# Patient Record
Sex: Female | Born: 1950 | Race: Black or African American | Hispanic: No | Marital: Married | Smoking: Never smoker
Health system: Southern US, Community
[De-identification: ages and names within clinical notes are randomized; demographics above are authoritative.]

## PROBLEM LIST (undated history)

## (undated) DIAGNOSIS — M5020 Other cervical disc displacement, unspecified cervical region: Secondary | ICD-10-CM

## (undated) DIAGNOSIS — M5412 Radiculopathy, cervical region: Secondary | ICD-10-CM

## (undated) DIAGNOSIS — F329 Major depressive disorder, single episode, unspecified: Secondary | ICD-10-CM

## (undated) DIAGNOSIS — R202 Paresthesia of skin: Secondary | ICD-10-CM

## (undated) DIAGNOSIS — I82409 Acute embolism and thrombosis of unspecified deep veins of unspecified lower extremity: Secondary | ICD-10-CM

## (undated) DIAGNOSIS — F419 Anxiety disorder, unspecified: Secondary | ICD-10-CM

## (undated) DIAGNOSIS — Z8489 Family history of other specified conditions: Secondary | ICD-10-CM

## (undated) DIAGNOSIS — Z9889 Other specified postprocedural states: Secondary | ICD-10-CM

## (undated) DIAGNOSIS — R112 Nausea with vomiting, unspecified: Secondary | ICD-10-CM

## (undated) DIAGNOSIS — M542 Cervicalgia: Secondary | ICD-10-CM

## (undated) DIAGNOSIS — I10 Essential (primary) hypertension: Secondary | ICD-10-CM

## (undated) DIAGNOSIS — M1990 Unspecified osteoarthritis, unspecified site: Secondary | ICD-10-CM

## (undated) DIAGNOSIS — M47812 Spondylosis without myelopathy or radiculopathy, cervical region: Secondary | ICD-10-CM

## (undated) DIAGNOSIS — R200 Anesthesia of skin: Secondary | ICD-10-CM

## (undated) DIAGNOSIS — F32A Depression, unspecified: Secondary | ICD-10-CM

## (undated) HISTORY — PX: STYLOID PROCESS EXCISION: SHX5198

## (undated) HISTORY — PX: LUMBAR LAMINECTOMY: SHX95

## (undated) HISTORY — PX: CERVICAL LAMINECTOMY: SHX94

## (undated) HISTORY — PX: CERVICAL DISCECTOMY: SHX98

---

## 2010-03-20 ENCOUNTER — Ambulatory Visit (HOSPITAL_COMMUNITY): Admission: RE | Admit: 2010-03-20 | Payer: Self-pay | Source: Home / Self Care | Admitting: Neurosurgery

## 2010-04-19 ENCOUNTER — Other Ambulatory Visit (HOSPITAL_COMMUNITY): Payer: Self-pay

## 2010-04-19 LAB — CBC
HCT: 39.3 % (ref 36.0–46.0)
Hemoglobin: 13.1 g/dL (ref 12.0–15.0)
MCH: 27.9 pg (ref 26.0–34.0)
MCHC: 33.3 g/dL (ref 30.0–36.0)
RBC: 4.69 MIL/uL (ref 3.87–5.11)

## 2010-04-19 LAB — BASIC METABOLIC PANEL
CO2: 27 mEq/L (ref 19–32)
Calcium: 9.1 mg/dL (ref 8.4–10.5)
Chloride: 104 mEq/L (ref 96–112)
Glucose, Bld: 101 mg/dL — ABNORMAL HIGH (ref 70–99)
Sodium: 140 mEq/L (ref 135–145)

## 2010-04-19 LAB — SURGICAL PCR SCREEN: MRSA, PCR: NEGATIVE

## 2010-04-24 ENCOUNTER — Ambulatory Visit (HOSPITAL_COMMUNITY): Payer: BC Managed Care – PPO

## 2010-04-24 ENCOUNTER — Ambulatory Visit (HOSPITAL_COMMUNITY)
Admission: RE | Admit: 2010-04-24 | Discharge: 2010-04-26 | Disposition: A | Discharge status: Home / Self Care | Payer: BC Managed Care – PPO | Source: Ambulatory Visit | Attending: Neurosurgery | Admitting: Neurosurgery

## 2010-04-24 DIAGNOSIS — F3289 Other specified depressive episodes: Secondary | ICD-10-CM | POA: Insufficient documentation

## 2010-04-24 DIAGNOSIS — Z01812 Encounter for preprocedural laboratory examination: Secondary | ICD-10-CM | POA: Insufficient documentation

## 2010-04-24 DIAGNOSIS — F329 Major depressive disorder, single episode, unspecified: Secondary | ICD-10-CM | POA: Insufficient documentation

## 2010-04-24 DIAGNOSIS — K219 Gastro-esophageal reflux disease without esophagitis: Secondary | ICD-10-CM | POA: Insufficient documentation

## 2010-04-24 DIAGNOSIS — M47812 Spondylosis without myelopathy or radiculopathy, cervical region: Secondary | ICD-10-CM | POA: Insufficient documentation

## 2010-04-24 DIAGNOSIS — I10 Essential (primary) hypertension: Secondary | ICD-10-CM | POA: Insufficient documentation

## 2010-04-24 DIAGNOSIS — M5020 Other cervical disc displacement, unspecified cervical region: Secondary | ICD-10-CM | POA: Insufficient documentation

## 2010-04-24 DIAGNOSIS — M5030 Other cervical disc degeneration, unspecified cervical region: Secondary | ICD-10-CM | POA: Insufficient documentation

## 2010-04-27 NOTE — Op Note (Signed)
NAME:  Cheyenne, Gonzalez NO.:  [id number]  MEDICAL RECORD NO.:  [id number]           PATIENT TYPE:  O  LOCATION:  3535                         FACILITY:  [facility]  PHYSICIAN:  [name]. Venetia Maxon, M.D.  DATE OF BIRTH:  1950/07/07  DATE OF PROCEDURE:  04/24/2010 DATE OF DISCHARGE:                              OPERATIVE REPORT   PREOPERATIVE DIAGNOSIS:  Herniated cervical disk with cervical spondylosis, degenerative disk disease, and radiculopathy, C5-C6 and C6- C7 levels.  POSTOPERATIVE DIAGNOSIS:  Herniated cervical disk with cervical spondylosis, degenerative disk disease, and radiculopathy, C5-C6 and C6- C7 levels.  PROCEDURE:  Anterior cervical decompression and fusion, C5-C6 and C6-C7 with PEEK interbody cages, morselized bone autograft, allograft, PureGen, and anterior cervical plate.  SURGEON:  [name]. Venetia Maxon, MD  ASSISTANTS: 1. Georgiann Cocker, RN 2. Cristi Loron, MD  ANESTHESIA:  General endotracheal anesthesia.  ESTIMATED BLOOD LOSS:  Minimal.  COMPLICATIONS:  None.  DISPOSITION:  Recovery.  INDICATIONS:  Cheyenne Gonzalez is a 60 year old woman who has previously had a posterior cervical laminectomy by outside treating physician with persistent pain and spondylitic degenerative changes causing persistent nerve root compression.  It was elected to take her to surgery for anterior cervical decompression and fusion at C5-C6 and C6-C7 levels.  PROCEDURE IN DETAIL:  Cheyenne Gonzalez was brought to the operating room. Following a satisfactory and an uncomplicated induction of general endotracheal anesthesia and placement of intravenous lines, the patient was placed in a supine position on the operating table.  Her neck was placed in slight extension.  She was placed in 5 pounds of halter traction.  Anterior neck was then prepped and draped in the usual sterile fashion.  The area of planned incision was infiltrated with local lidocaine.  An incision was  made in the middle neck crease on the left from the midline to the anterior border of sternocleidomastoid muscle and carried sharply through the platysma layers.  Subplatysmal dissection was performed exposing the anterior border of sternocleidomastoid muscle.  Using blunt dissection, the carotid sheath was kept lateral, trachea and esophagus kept medial exposing the anterior cervical spine.  Bent spinal needles were placed what were felt to be C4-C5 and C5-C6 levels and this was confirmed on the intraoperative x-ray.  Subsequently exposure was carried to the C5-C6 and C6-C7 levels.  Longus colli muscles were taken down bilaterally. Large ventral osteophytes were removed.  Self-retaining retractors were placed to facilitate exposure.  The distraction pins were placed at C5, C6, and C7 and using gentle interspace distraction, the extremely degenerated and collapsed disk space at C5-C6 was opened up.  Endplates were eburnated with a high-speed drill.  Ventral osteophytes were removed and saved for later use in bone grafting.  The endplates weredecorticated with a high-speed drill and along with uncinate spurs attention was then turned to the C6-C7 level where similar decompression was performed.  The microscope was brought into field and more thorough decompression under the microscope was then performed with decompression of the spinal cord dura and both C7 nerve roots were widely decompressed.  Hemostasis was assured.  Gelfoam soaked in thrombin. Attention  was then turned to the C5-C6 level where a thorough decompression was performed and both C6 nerve roots were widely decompressed.  There was some scarring on the left overlying the C6 nerve root and this was also decompressed.  Hemostasis was assured. After trial sizing, a 5-mm PEEK interbody cage was selected, packed with PureGen on allograft bone wedges and supplemented with bone autograft and countersunk appropriately.  Attention was  then turned to the C6-C7, where a similarly sized graft was placed.  The distraction pins were removed and Gelfoam was packed in the distraction pin holes.  A 32-mm Trestle anterior cervical plate was then affixed to the anterior cervical spine using 4 x 14 mm screws, two at C5, two at C6, and two at C7.  All screws had excellent purchase.  Locking mechanisms were engaged.  Traction weight was removed prior to placing the plate and screws.  Wound was then irrigated.  Soft tissues were inspected and found to be in good repair.  The hemostasis was assured.  Platysma layer was closed with 3-0 Vicryl sutures.  Skin edges were reapproximated with 3-0 Vicryl subcuticular stitch.  The wound was dressed with Dermabond. An x-ray obtained prior to closure demonstrated well-positioned interbody grafts in the anterior cervical plate at the correct levels. The wound was dressed with Dermabond.  The patient was extubated in the operating room and taken to recovery in stable and satisfactory condition having tolerated the operation well.  Counts were correct at the end of the case.     [name]. Venetia Maxon, M.D.     JDS/MEDQ  D:  04/24/2010  T:  04/24/2010  Job:  [id number]  Electronically Signed by Maeola Harman M.D. on 04/27/2010 10:20:07 AM

## 2011-02-11 ENCOUNTER — Other Ambulatory Visit (HOSPITAL_COMMUNITY): Payer: Self-pay | Admitting: Neurosurgery

## 2011-02-11 ENCOUNTER — Other Ambulatory Visit: Payer: Self-pay | Admitting: Neurosurgery

## 2011-02-11 DIAGNOSIS — M542 Cervicalgia: Secondary | ICD-10-CM

## 2011-02-26 ENCOUNTER — Other Ambulatory Visit: Payer: Self-pay | Admitting: Neurosurgery

## 2011-02-27 ENCOUNTER — Ambulatory Visit (HOSPITAL_COMMUNITY)
Admission: RE | Admit: 2011-02-27 | Discharge: 2011-02-27 | Disposition: A | Discharge status: Home / Self Care | Payer: BC Managed Care – PPO | Source: Ambulatory Visit | Attending: Neurosurgery | Admitting: Neurosurgery

## 2011-02-27 DIAGNOSIS — M542 Cervicalgia: Secondary | ICD-10-CM | POA: Insufficient documentation

## 2011-02-27 DIAGNOSIS — Z981 Arthrodesis status: Secondary | ICD-10-CM | POA: Insufficient documentation

## 2011-02-27 DIAGNOSIS — M5020 Other cervical disc displacement, unspecified cervical region: Secondary | ICD-10-CM | POA: Insufficient documentation

## 2011-02-27 MED ORDER — ONDANSETRON HCL 4 MG/2ML IJ SOLN: 4.0000 mg | Freq: Four times a day (QID) | INTRAMUSCULAR | Status: DC | PRN

## 2011-02-27 MED ORDER — OXYCODONE-ACETAMINOPHEN 5-325 MG PO TABS
2.0000 | ORAL_TABLET | ORAL | Status: DC | PRN
  Administered 2011-02-27: 2 via ORAL

## 2011-02-27 MED ORDER — DIAZEPAM 5 MG PO TABS
10.0000 mg | ORAL_TABLET | Freq: Once | ORAL | Status: AC
  Administered 2011-02-27: 10 mg via ORAL

## 2011-02-27 MED ORDER — OXYCODONE-ACETAMINOPHEN 5-325 MG PO TABS
ORAL_TABLET | ORAL | Status: AC
  Administered 2011-02-27: 2 via ORAL
  Filled 2011-02-27: qty 2

## 2011-02-27 MED ORDER — DIAZEPAM 5 MG PO TABS
5.0000 mg | ORAL_TABLET | Freq: Once | ORAL | Status: AC
  Administered 2011-02-27: 5 mg via ORAL

## 2011-02-27 MED ORDER — IOHEXOL 300 MG/ML  SOLN
10.0000 mL | Freq: Once | INTRAMUSCULAR | Status: AC | PRN
  Administered 2011-02-27: 10 mL via INTRATHECAL

## 2011-02-27 NOTE — Procedures (Signed)
L3/4 puncture with Omnipaque 300

## 2011-10-08 ENCOUNTER — Other Ambulatory Visit: Payer: Self-pay | Admitting: Neurosurgery

## 2011-12-10 ENCOUNTER — Encounter (HOSPITAL_COMMUNITY): Payer: Self-pay | Admitting: Pharmacy Technician

## 2011-12-17 ENCOUNTER — Ambulatory Visit (HOSPITAL_COMMUNITY)
Admission: RE | Admit: 2011-12-17 | Discharge: 2011-12-17 | Disposition: A | Discharge status: Home / Self Care | Payer: BC Managed Care – PPO | Source: Ambulatory Visit | Attending: Neurosurgery | Admitting: Neurosurgery

## 2011-12-17 ENCOUNTER — Encounter (HOSPITAL_COMMUNITY): Payer: Self-pay

## 2011-12-17 ENCOUNTER — Encounter (HOSPITAL_COMMUNITY)
Admission: RE | Admit: 2011-12-17 | Discharge: 2011-12-17 | Disposition: A | Discharge status: Home / Self Care | Payer: BC Managed Care – PPO | Source: Ambulatory Visit | Attending: Neurosurgery | Admitting: Neurosurgery

## 2011-12-17 DIAGNOSIS — Z01818 Encounter for other preprocedural examination: Secondary | ICD-10-CM | POA: Insufficient documentation

## 2011-12-17 DIAGNOSIS — I10 Essential (primary) hypertension: Secondary | ICD-10-CM | POA: Insufficient documentation

## 2011-12-17 DIAGNOSIS — Z01810 Encounter for preprocedural cardiovascular examination: Secondary | ICD-10-CM | POA: Insufficient documentation

## 2011-12-17 DIAGNOSIS — Z01812 Encounter for preprocedural laboratory examination: Secondary | ICD-10-CM | POA: Insufficient documentation

## 2011-12-17 HISTORY — DX: Radiculopathy, cervical region: M54.12

## 2011-12-17 HISTORY — DX: Unspecified osteoarthritis, unspecified site: M19.90

## 2011-12-17 HISTORY — DX: Cervicalgia: M54.2

## 2011-12-17 HISTORY — DX: Other specified postprocedural states: Z98.890

## 2011-12-17 HISTORY — DX: Other cervical disc displacement, unspecified cervical region: M50.20

## 2011-12-17 HISTORY — DX: Spondylosis without myelopathy or radiculopathy, cervical region: M47.812

## 2011-12-17 HISTORY — DX: Family history of other specified conditions: Z84.89

## 2011-12-17 HISTORY — DX: Paresthesia of skin: R20.2

## 2011-12-17 HISTORY — DX: Depression, unspecified: F32.A

## 2011-12-17 HISTORY — DX: Essential (primary) hypertension: I10

## 2011-12-17 HISTORY — DX: Anxiety disorder, unspecified: F41.9

## 2011-12-17 HISTORY — DX: Other specified postprocedural states: R11.2

## 2011-12-17 HISTORY — DX: Major depressive disorder, single episode, unspecified: F32.9

## 2011-12-17 HISTORY — DX: Acute embolism and thrombosis of unspecified deep veins of unspecified lower extremity: I82.409

## 2011-12-17 HISTORY — DX: Anesthesia of skin: R20.0

## 2011-12-17 LAB — BASIC METABOLIC PANEL
BUN: 15 mg/dL (ref 6–23)
CO2: 24 mEq/L (ref 19–32)
Chloride: 98 mEq/L (ref 96–112)
Creatinine, Ser: 0.82 mg/dL (ref 0.50–1.10)
GFR calc Af Amer: 89 mL/min — ABNORMAL LOW (ref >90)
Potassium: 3.5 mEq/L (ref 3.5–5.1)

## 2011-12-17 LAB — SURGICAL PCR SCREEN: MRSA, PCR: NEGATIVE

## 2011-12-17 LAB — CBC
HCT: 39.3 % (ref 36.0–46.0)
Hemoglobin: 13.9 g/dL (ref 12.0–15.0)
MCHC: 35.4 g/dL (ref 30.0–36.0)
MCV: 84.5 fL (ref 78.0–100.0)
RDW: 13.4 % (ref 11.5–15.5)

## 2011-12-17 NOTE — Progress Notes (Signed)
Patient unsure terminology on consent request to sign consent DOS after speaking with Dr. Venetia Maxon. Requested stress test from [facility]

## 2011-12-17 NOTE — Pre-Procedure Instructions (Signed)
20 Cheyenne Gonzalez  12/17/2011   Your procedure is scheduled on:  October 8  Report to [facility] at 05:30 AM.  Call this number if you have problems the morning of surgery: [phone]   Remember:   Do not eat or drink:After Midnight.  Take these medicines the morning of surgery with A SIP OF WATER: Diazepam, Hydrocodone (if needed)     STOP Oscal, Fish Oil, Red yeast after today, Aspirin  Do not wear jewelry, make-up or nail polish.  Do not wear lotions, powders, or perfumes. You may wear deodorant.  Do not shave 48 hours prior to surgery. Men may shave face and neck.  Do not bring valuables to the hospital.  Contacts, dentures or bridgework may not be worn into surgery.  Leave suitcase in the car. After surgery it may be brought to your room.  For patients admitted to the hospital, checkout time is 11:00 AM the day of discharge.   Special Instructions: Shower using CHG 2 nights before surgery and the night before surgery.  If you shower the day of surgery use CHG.  Use special wash - you have one bottle of CHG for all showers.  You should use approximately 1/3 of the bottle for each shower.   Please read over the following fact sheets that you were given: Pain Booklet, Coughing and Deep Breathing, MRSA Information and Surgical Site Infection Prevention

## 2011-12-23 MED ORDER — CEFAZOLIN SODIUM-DEXTROSE 2-3 GM-% IV SOLR
2.0000 g | INTRAVENOUS | Status: AC
  Administered 2011-12-24: 2 g via INTRAVENOUS
  Filled 2011-12-23: qty 50

## 2011-12-24 ENCOUNTER — Encounter (HOSPITAL_COMMUNITY): Payer: Self-pay | Admitting: Anesthesiology

## 2011-12-24 ENCOUNTER — Encounter (HOSPITAL_COMMUNITY): Payer: Self-pay | Admitting: *Deleted

## 2011-12-24 ENCOUNTER — Ambulatory Visit (HOSPITAL_COMMUNITY): Payer: BC Managed Care – PPO | Admitting: Anesthesiology

## 2011-12-24 ENCOUNTER — Ambulatory Visit (HOSPITAL_COMMUNITY): Payer: BC Managed Care – PPO

## 2011-12-24 ENCOUNTER — Encounter (HOSPITAL_COMMUNITY)
Admission: RE | Disposition: A | Discharge status: Home / Self Care | Payer: Self-pay | Source: Ambulatory Visit | Attending: Neurosurgery

## 2011-12-24 ENCOUNTER — Observation Stay (HOSPITAL_COMMUNITY)
Admission: RE | Admit: 2011-12-24 | Discharge: 2011-12-27 | Disposition: A | Discharge status: Home / Self Care | Payer: BC Managed Care – PPO | Source: Ambulatory Visit | Attending: Neurosurgery | Admitting: Neurosurgery

## 2011-12-24 DIAGNOSIS — M47812 Spondylosis without myelopathy or radiculopathy, cervical region: Secondary | ICD-10-CM | POA: Insufficient documentation

## 2011-12-24 DIAGNOSIS — M5020 Other cervical disc displacement, unspecified cervical region: Principal | ICD-10-CM | POA: Insufficient documentation

## 2011-12-24 DIAGNOSIS — I10 Essential (primary) hypertension: Secondary | ICD-10-CM | POA: Insufficient documentation

## 2011-12-24 HISTORY — PX: POSTERIOR CERVICAL FUSION/FORAMINOTOMY: SHX5038

## 2011-12-24 SURGERY — POSTERIOR CERVICAL FUSION/FORAMINOTOMY LEVEL 3
Anesthesia: General | Site: Neck

## 2011-12-24 MED ORDER — ONDANSETRON HCL 4 MG/2ML IJ SOLN
INTRAMUSCULAR | Status: DC | PRN
  Administered 2011-12-24 (×2): 4 mg via INTRAVENOUS

## 2011-12-24 MED ORDER — GLYCOPYRROLATE 0.2 MG/ML IJ SOLN
INTRAMUSCULAR | Status: DC | PRN
  Administered 2011-12-24: .8 mg via INTRAVENOUS

## 2011-12-24 MED ORDER — METHOCARBAMOL 100 MG/ML IJ SOLN
500.0000 mg | Freq: Four times a day (QID) | INTRAVENOUS | Status: DC | PRN
  Filled 2011-12-24: qty 5

## 2011-12-24 MED ORDER — SODIUM CHLORIDE 0.9 % IJ SOLN
3.0000 mL | Freq: Two times a day (BID) | INTRAMUSCULAR | Status: DC
  Administered 2011-12-24 – 2011-12-26 (×5): 3 mL via INTRAVENOUS

## 2011-12-24 MED ORDER — DEXAMETHASONE SODIUM PHOSPHATE 10 MG/ML IJ SOLN
INTRAMUSCULAR | Status: DC | PRN
  Administered 2011-12-24: 10 mg via INTRAVENOUS

## 2011-12-24 MED ORDER — HYDROCODONE-ACETAMINOPHEN 5-325 MG PO TABS: 1.0000 | ORAL_TABLET | ORAL | Status: DC | PRN

## 2011-12-24 MED ORDER — ONDANSETRON HCL 4 MG/2ML IJ SOLN: 4.0000 mg | Freq: Once | INTRAMUSCULAR | Status: DC | PRN

## 2011-12-24 MED ORDER — PHENOL 1.4 % MT LIQD
1.0000 | OROMUCOSAL | Status: DC | PRN
  Administered 2011-12-26: 1 via OROMUCOSAL
  Filled 2011-12-24: qty 177

## 2011-12-24 MED ORDER — VECURONIUM BROMIDE 10 MG IV SOLR
INTRAVENOUS | Status: DC | PRN
  Administered 2011-12-24: 2 mg via INTRAVENOUS

## 2011-12-24 MED ORDER — DOCUSATE SODIUM 100 MG PO CAPS
100.0000 mg | ORAL_CAPSULE | Freq: Two times a day (BID) | ORAL | Status: DC
  Administered 2011-12-24 – 2011-12-26 (×5): 100 mg via ORAL
  Filled 2011-12-24 (×5): qty 1

## 2011-12-24 MED ORDER — CALCIUM CARBONATE-VITAMIN D 500-200 MG-UNIT PO TABS
1.0000 | ORAL_TABLET | Freq: Every day | ORAL | Status: DC
  Administered 2011-12-25 – 2011-12-26 (×2): 1 via ORAL
  Filled 2011-12-24 (×4): qty 1

## 2011-12-24 MED ORDER — SODIUM CHLORIDE 0.9 % IR SOLN
Status: DC | PRN
  Administered 2011-12-24: 08:00:00

## 2011-12-24 MED ORDER — MORPHINE SULFATE 2 MG/ML IJ SOLN
1.0000 mg | INTRAMUSCULAR | Status: DC | PRN
  Administered 2011-12-25: 2 mg via INTRAVENOUS
  Filled 2011-12-24: qty 1

## 2011-12-24 MED ORDER — MEPERIDINE HCL 25 MG/ML IJ SOLN: 6.2500 mg | INTRAMUSCULAR | Status: DC | PRN

## 2011-12-24 MED ORDER — ACETAMINOPHEN 325 MG PO TABS: 650.0000 mg | ORAL_TABLET | ORAL | Status: DC | PRN

## 2011-12-24 MED ORDER — TRIAMTERENE-HCTZ 37.5-25 MG PO TABS
1.0000 | ORAL_TABLET | Freq: Every day | ORAL | Status: DC
  Administered 2011-12-24 – 2011-12-26 (×3): 1 via ORAL
  Filled 2011-12-24 (×4): qty 1

## 2011-12-24 MED ORDER — ACETAMINOPHEN 650 MG RE SUPP: 650.0000 mg | RECTAL | Status: DC | PRN

## 2011-12-24 MED ORDER — LIDOCAINE-EPINEPHRINE 1 %-1:100000 IJ SOLN
INTRAMUSCULAR | Status: DC | PRN
  Administered 2011-12-24: 10 mL

## 2011-12-24 MED ORDER — THROMBIN 5000 UNITS EX SOLR
CUTANEOUS | Status: DC | PRN
  Administered 2011-12-24 (×2): 5000 [IU] via TOPICAL

## 2011-12-24 MED ORDER — OXYCODONE HCL 5 MG/5ML PO SOLN: 5.0000 mg | Freq: Once | ORAL | Status: AC | PRN

## 2011-12-24 MED ORDER — ALUM & MAG HYDROXIDE-SIMETH 200-200-20 MG/5ML PO SUSP: 30.0000 mL | Freq: Four times a day (QID) | ORAL | Status: DC | PRN

## 2011-12-24 MED ORDER — MIDAZOLAM HCL 5 MG/5ML IJ SOLN
INTRAMUSCULAR | Status: DC | PRN
  Administered 2011-12-24: 2 mg via INTRAVENOUS

## 2011-12-24 MED ORDER — MENTHOL 3 MG MT LOZG
1.0000 | LOZENGE | OROMUCOSAL | Status: DC | PRN
  Administered 2011-12-26: 3 mg via ORAL
  Filled 2011-12-24: qty 9

## 2011-12-24 MED ORDER — OXYCODONE HCL 5 MG PO TABS
5.0000 mg | ORAL_TABLET | Freq: Once | ORAL | Status: AC | PRN
  Administered 2011-12-24: 5 mg via ORAL

## 2011-12-24 MED ORDER — SODIUM CHLORIDE 0.9 % IV SOLN: 250.0000 mL | INTRAVENOUS | Status: DC

## 2011-12-24 MED ORDER — LACTATED RINGERS IV SOLN
INTRAVENOUS | Status: DC | PRN
  Administered 2011-12-24 (×2): via INTRAVENOUS

## 2011-12-24 MED ORDER — METHOCARBAMOL 500 MG PO TABS
500.0000 mg | ORAL_TABLET | Freq: Four times a day (QID) | ORAL | Status: DC | PRN
  Administered 2011-12-24 – 2011-12-25 (×2): 500 mg via ORAL
  Filled 2011-12-24 (×2): qty 1

## 2011-12-24 MED ORDER — HYDROMORPHONE HCL PF 1 MG/ML IJ SOLN
INTRAMUSCULAR | Status: AC
  Administered 2011-12-24: 0.5 mg via INTRAVENOUS
  Filled 2011-12-24: qty 1

## 2011-12-24 MED ORDER — PROPOFOL 10 MG/ML IV BOLUS
INTRAVENOUS | Status: DC | PRN
  Administered 2011-12-24: 120 mg via INTRAVENOUS

## 2011-12-24 MED ORDER — ROCURONIUM BROMIDE 100 MG/10ML IV SOLN
INTRAVENOUS | Status: DC | PRN
  Administered 2011-12-24: 50 mg via INTRAVENOUS

## 2011-12-24 MED ORDER — EPHEDRINE SULFATE 50 MG/ML IJ SOLN
INTRAMUSCULAR | Status: DC | PRN
  Administered 2011-12-24: 10 mg via INTRAVENOUS

## 2011-12-24 MED ORDER — ONDANSETRON HCL 4 MG/2ML IJ SOLN: 4.0000 mg | INTRAMUSCULAR | Status: DC | PRN

## 2011-12-24 MED ORDER — DIAZEPAM 5 MG PO TABS
10.0000 mg | ORAL_TABLET | Freq: Four times a day (QID) | ORAL | Status: DC | PRN
  Administered 2011-12-24 – 2011-12-27 (×7): 10 mg via ORAL
  Filled 2011-12-24 (×6): qty 2

## 2011-12-24 MED ORDER — OXYCODONE-ACETAMINOPHEN 5-325 MG PO TABS
1.0000 | ORAL_TABLET | ORAL | Status: DC | PRN
  Administered 2011-12-24 – 2011-12-26 (×6): 2 via ORAL
  Administered 2011-12-26: 1 via ORAL
  Filled 2011-12-24 (×8): qty 2

## 2011-12-24 MED ORDER — POVIDONE-IODINE 10 % EX OINT
TOPICAL_OINTMENT | CUTANEOUS | Status: DC | PRN
  Administered 2011-12-24: 1 via TOPICAL

## 2011-12-24 MED ORDER — PANTOPRAZOLE SODIUM 40 MG IV SOLR
40.0000 mg | Freq: Every day | INTRAVENOUS | Status: DC
  Filled 2011-12-24: qty 40

## 2011-12-24 MED ORDER — SCOPOLAMINE 1 MG/3DAYS TD PT72
MEDICATED_PATCH | TRANSDERMAL | Status: DC | PRN
  Administered 2011-12-24: 1 via TRANSDERMAL

## 2011-12-24 MED ORDER — OXYCODONE HCL 5 MG PO TABS
ORAL_TABLET | ORAL | Status: AC
  Administered 2011-12-24: 5 mg via ORAL
  Filled 2011-12-24: qty 1

## 2011-12-24 MED ORDER — NEOSTIGMINE METHYLSULFATE 1 MG/ML IJ SOLN
INTRAMUSCULAR | Status: DC | PRN
  Administered 2011-12-24: 4 mg via INTRAVENOUS

## 2011-12-24 MED ORDER — CEFAZOLIN SODIUM 1-5 GM-% IV SOLN
1.0000 g | Freq: Three times a day (TID) | INTRAVENOUS | Status: AC
  Administered 2011-12-24 – 2011-12-25 (×2): 1 g via INTRAVENOUS
  Filled 2011-12-24 (×2): qty 50

## 2011-12-24 MED ORDER — PANTOPRAZOLE SODIUM 40 MG PO TBEC
40.0000 mg | DELAYED_RELEASE_TABLET | Freq: Every day | ORAL | Status: DC
  Administered 2011-12-24 – 2011-12-26 (×3): 40 mg via ORAL
  Filled 2011-12-24 (×3): qty 1

## 2011-12-24 MED ORDER — 0.9 % SODIUM CHLORIDE (POUR BTL) OPTIME
TOPICAL | Status: DC | PRN
  Administered 2011-12-24: 1000 mL

## 2011-12-24 MED ORDER — HYDROCODONE-ACETAMINOPHEN 7.5-325 MG PO TABS
1.0000 | ORAL_TABLET | ORAL | Status: DC | PRN
  Administered 2011-12-25: 2 via ORAL
  Filled 2011-12-24: qty 2

## 2011-12-24 MED ORDER — HYDROMORPHONE HCL PF 1 MG/ML IJ SOLN
0.2500 mg | INTRAMUSCULAR | Status: DC | PRN
  Administered 2011-12-24 (×4): 0.5 mg via INTRAVENOUS

## 2011-12-24 MED ORDER — HEMOSTATIC AGENTS (NO CHARGE) OPTIME
TOPICAL | Status: DC | PRN
  Administered 2011-12-24: 1 via TOPICAL

## 2011-12-24 MED ORDER — KCL IN DEXTROSE-NACL 30-5-0.45 MEQ/L-%-% IV SOLN
INTRAVENOUS | Status: DC
  Filled 2011-12-24 (×7): qty 1000

## 2011-12-24 MED ORDER — SCOPOLAMINE 1 MG/3DAYS TD PT72
MEDICATED_PATCH | TRANSDERMAL | Status: AC
  Filled 2011-12-24: qty 1

## 2011-12-24 MED ORDER — LIDOCAINE HCL (CARDIAC) 20 MG/ML IV SOLN
INTRAVENOUS | Status: DC | PRN
  Administered 2011-12-24: 60 mg via INTRAVENOUS

## 2011-12-24 MED ORDER — BACITRACIN 50000 UNITS IM SOLR
INTRAMUSCULAR | Status: AC
  Filled 2011-12-24: qty 1

## 2011-12-24 MED ORDER — DIAZEPAM 5 MG PO TABS
ORAL_TABLET | ORAL | Status: AC
  Administered 2011-12-24: 10 mg via ORAL
  Filled 2011-12-24: qty 2

## 2011-12-24 MED ORDER — SODIUM CHLORIDE 0.9 % IV SOLN
INTRAVENOUS | Status: AC
  Filled 2011-12-24: qty 500

## 2011-12-24 MED ORDER — ZOLPIDEM TARTRATE 5 MG PO TABS: 5.0000 mg | ORAL_TABLET | Freq: Every evening | ORAL | Status: DC | PRN

## 2011-12-24 MED ORDER — DEXAMETHASONE SODIUM PHOSPHATE 10 MG/ML IJ SOLN
INTRAMUSCULAR | Status: AC
  Filled 2011-12-24: qty 1

## 2011-12-24 MED ORDER — SODIUM CHLORIDE 0.9 % IJ SOLN: 3.0000 mL | INTRAMUSCULAR | Status: DC | PRN

## 2011-12-24 MED ORDER — FENTANYL CITRATE 0.05 MG/ML IJ SOLN
INTRAMUSCULAR | Status: DC | PRN
  Administered 2011-12-24 (×2): 50 ug via INTRAVENOUS
  Administered 2011-12-24: 100 ug via INTRAVENOUS
  Administered 2011-12-24: 50 ug via INTRAVENOUS

## 2011-12-24 SURGICAL SUPPLY — 73 items
BAG DECANTER FOR FLEXI CONT (MISCELLANEOUS) ×2 IMPLANT
BENZOIN TINCTURE PRP APPL 2/3 (GAUZE/BANDAGES/DRESSINGS) ×4 IMPLANT
BIT DRILL NEURO 2X3.1 SFT TUCH (MISCELLANEOUS) ×1 IMPLANT
BLADE SURG 11 STRL SS (BLADE) ×2 IMPLANT
BLADE SURG ROTATE 9660 (MISCELLANEOUS) ×2 IMPLANT
BLADE ULTRA TIP 2M (BLADE) IMPLANT
BONE VOID FILLER STRIP 10CC (Bone Implant) ×2 IMPLANT
BUR PRECISION FLUTE 5.0 (BURR) ×2 IMPLANT
CANISTER SUCTION 2500CC (MISCELLANEOUS) ×2 IMPLANT
CLOTH BEACON ORANGE TIMEOUT ST (SAFETY) ×2 IMPLANT
CONT SPEC 4OZ CLIKSEAL STRL BL (MISCELLANEOUS) ×2 IMPLANT
DRAPE C-ARM 42X72 X-RAY (DRAPES) ×4 IMPLANT
DRAPE LAPAROTOMY 100X72 PEDS (DRAPES) ×2 IMPLANT
DRAPE MICROSCOPE LEICA (MISCELLANEOUS) IMPLANT
DRAPE POUCH INSTRU U-SHP 10X18 (DRAPES) ×2 IMPLANT
DRESSING TELFA 8X3 (GAUZE/BANDAGES/DRESSINGS) ×2 IMPLANT
DRILL 12MM (DRILL) ×2 IMPLANT
DRILL NEURO 2X3.1 SOFT TOUCH (MISCELLANEOUS) ×2
DRSG PAD ABDOMINAL 8X10 ST (GAUZE/BANDAGES/DRESSINGS) IMPLANT
DURAPREP 6ML APPLICATOR 50/CS (WOUND CARE) ×2 IMPLANT
ELECT REM PT RETURN 9FT ADLT (ELECTROSURGICAL) ×2
ELECTRODE REM PT RTRN 9FT ADLT (ELECTROSURGICAL) ×1 IMPLANT
GAUZE SPONGE 4X4 16PLY XRAY LF (GAUZE/BANDAGES/DRESSINGS) IMPLANT
GLOVE BIO SURGEON STRL SZ8 (GLOVE) ×2 IMPLANT
GLOVE BIOGEL M 8.0 STRL (GLOVE) ×2 IMPLANT
GLOVE BIOGEL PI IND STRL 8 (GLOVE) ×3 IMPLANT
GLOVE BIOGEL PI IND STRL 8.5 (GLOVE) ×1 IMPLANT
GLOVE BIOGEL PI INDICATOR 8 (GLOVE) ×3
GLOVE BIOGEL PI INDICATOR 8.5 (GLOVE) ×1
GLOVE ECLIPSE 7.5 STRL STRAW (GLOVE) IMPLANT
GLOVE ECLIPSE 8.0 STRL XLNG CF (GLOVE) ×8 IMPLANT
GLOVE EXAM NITRILE LRG STRL (GLOVE) IMPLANT
GLOVE EXAM NITRILE MD LF STRL (GLOVE) IMPLANT
GLOVE EXAM NITRILE XL STR (GLOVE) IMPLANT
GLOVE EXAM NITRILE XS STR PU (GLOVE) IMPLANT
GOWN BRE IMP SLV AUR LG STRL (GOWN DISPOSABLE) ×2 IMPLANT
GOWN BRE IMP SLV AUR XL STRL (GOWN DISPOSABLE) ×2 IMPLANT
GOWN STRL REIN 2XL LVL4 (GOWN DISPOSABLE) ×6 IMPLANT
HEMOSTAT SURGICEL 2X14 (HEMOSTASIS) IMPLANT
KIT BASIN OR (CUSTOM PROCEDURE TRAY) ×2 IMPLANT
KIT INFUSE X SMALL 1.4CC (Orthopedic Implant) ×2 IMPLANT
KIT ROOM TURNOVER OR (KITS) ×2 IMPLANT
MARKER SKIN DUAL TIP RULER LAB (MISCELLANEOUS) ×2 IMPLANT
NEEDLE HYPO 18GX1.5 BLUNT FILL (NEEDLE) IMPLANT
NEEDLE HYPO 25X1 1.5 SAFETY (NEEDLE) ×2 IMPLANT
NEEDLE SPNL 22GX3.5 QUINCKE BK (NEEDLE) ×2 IMPLANT
NS IRRIG 1000ML POUR BTL (IV SOLUTION) ×2 IMPLANT
PACK LAMINECTOMY NEURO (CUSTOM PROCEDURE TRAY) ×2 IMPLANT
PIN MAYFIELD SKULL DISP (PIN) ×2 IMPLANT
ROD 120MM (Rod) ×2 IMPLANT
ROD SPNL 120X3.3XNS LF CVD (Rod) ×2 IMPLANT
RUBBERBAND STERILE (MISCELLANEOUS) IMPLANT
SCREW POLYAXIAL 12MM (Screw) ×14 IMPLANT
SCREW SET (Screw) ×14 IMPLANT
SPONGE GAUZE 4X4 12PLY (GAUZE/BANDAGES/DRESSINGS) ×2 IMPLANT
SPONGE INTESTINAL PEANUT (DISPOSABLE) IMPLANT
SPONGE SURGIFOAM ABS GEL SZ50 (HEMOSTASIS) ×2 IMPLANT
STAPLER SKIN PROX WIDE 3.9 (STAPLE) ×2 IMPLANT
STRIP CLOSURE SKIN 1/2X4 (GAUZE/BANDAGES/DRESSINGS) ×4 IMPLANT
SUT ETHILON 3 0 FSL (SUTURE) ×2 IMPLANT
SUT VIC AB 0 CT1 18XCR BRD8 (SUTURE) ×1 IMPLANT
SUT VIC AB 0 CT1 8-18 (SUTURE) ×1
SUT VIC AB 2-0 CP2 18 (SUTURE) ×2 IMPLANT
SUT VIC AB 3-0 SH 8-18 (SUTURE) ×4 IMPLANT
SYR 20ML ECCENTRIC (SYRINGE) ×2 IMPLANT
SYR 3ML LL SCALE MARK (SYRINGE) IMPLANT
TAPE CLOTH SURG 4X10 WHT LF (GAUZE/BANDAGES/DRESSINGS) ×2 IMPLANT
TOWEL OR 17X24 6PK STRL BLUE (TOWEL DISPOSABLE) ×2 IMPLANT
TOWEL OR 17X26 10 PK STRL BLUE (TOWEL DISPOSABLE) ×2 IMPLANT
TRAP SPECIMEN MUCOUS 40CC (MISCELLANEOUS) ×2 IMPLANT
TRAY FOLEY CATH 14FRSI W/METER (CATHETERS) IMPLANT
UNDERPAD 30X30 INCONTINENT (UNDERPADS AND DIAPERS) ×2 IMPLANT
WATER STERILE IRR 1000ML POUR (IV SOLUTION) ×2 IMPLANT

## 2011-12-24 NOTE — Interval H&P Note (Signed)
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 After consideration of risks, benefits and other options for treatment, the patient has consented to  Procedure(s) (LRB) with comments: POSTERIOR CERVICAL FUSION/FORAMINOTOMY LEVEL 3 (N/A) - C4 to C7 Posterior cervical Decompression/Fusion as a surgical intervention .  The patient's history has been reviewed, patient examined, no change in status, stable for surgery.  I have reviewed the patient's chart and labs.  Questions were answered to the patient's satisfaction.     [name]  Date of Initial H&P: 12/24/2011  History reviewed, patient examined, no change in status, stable for surgery.

## 2011-12-24 NOTE — Progress Notes (Signed)
NECK BRACE  WITH PATIENT  TO OR.

## 2011-12-24 NOTE — Anesthesia Procedure Notes (Signed)
Procedure Name: Intubation Date/Time: 12/24/2011 8:05 AM Performed by: Sharlene Dory E Pre-anesthesia Checklist: Patient identified, Emergency Drugs available, Suction available, Patient being monitored and Timeout performed Patient Re-evaluated:Patient Re-evaluated prior to inductionOxygen Delivery Method: Circle system utilized Preoxygenation: Pre-oxygenation with 100% oxygen Intubation Type: IV induction Ventilation: Mask ventilation without difficulty Laryngoscope Size: Mac and 3 Grade View: Grade II Tube type: Oral Tube size: 7.0 mm Number of attempts: 1 Airway Equipment and Method: Stylet Placement Confirmation: ETT inserted through vocal cords under direct vision,  positive ETCO2 and breath sounds checked- equal and bilateral Secured at: 21 cm Tube secured with: Tape Dental Injury: Teeth and Oropharynx as per pre-operative assessment

## 2011-12-24 NOTE — H&P (Signed)
Cheyenne Gonzalez  #[id number]  DOB:  08-25-1950  11/20/2011:  Cheyenne Gonzalez comes in today.  She has been continuing to suffer with her neck pain and does want to go ahead with surgery.  Per my previous discussion, we plan on doing posterior decompression and fusion C4 through C7 levels.  She has in the meantime been having more numbness in her hands, particularly at night.  She has to wake up and shake out her hands and she says her whole arm goes numb.  She says it is worse on the right side at night and during the day on the left side.    I examined her today and her strength appears to be full on confrontational testing.  She has some parascapular discomfort and pain in the back of her neck to palpation.  She does not appear to have any focal weakness.    We discussed whether or not to repeat her previous imaging studies, but per my review, even though her numbness has gradually worsened, she does not appear to have any worsening weakness and I do not think that there is a significant new problem for which we need to do additional studies.  She does have positive  Tinel's signs at the wrists and mildly at the medial epicondyle and I do think it is prudent, particularly with the nighttime numbness for when she has to shake out her hands, to make sure that she does not have evidence of peripheral entrapment neuropathy.  She did previously have EMG and nerve conduction tests which did not show an ulnar neuropathy or carpal tunnel syndrome, but I would like to make sure before proceeding with surgery that that is not an issue.  We plan on surgery as previously discussed on the 8th of October.  Risks and benefits were discussed.  We went over the exact nature of surgery and answered her and her husband's questions.  She wishes to proceed.          Cheyenne Gonzalez. Venetia Maxon, M.D./sv Evoleth Nordmeyer  #[id number] DOB:  1950/05/08 04/10/2011:  Anhthu Perdew comes back today to review her myelogram and post-myelogram CT scan.  She is  continuing to have right-sided posterior neck and right upper extremity pain radiating into her right deltoid.  She had on her myelogram and post-myelogram CT scan a marked degree of facet arthropathy at the C4-5 level on the right.  In addition, she has incomplete arthrodesis at the C5 through C7 levels.    She says that she continues to have severe pain and that she is very limited.  At this point, I told her that from a surgical option what I would recommend would be decompression and fusion posteriorly from C4 through C7 levels.  We discussed at length the specific recommendations. She wishes to consider her options.  She will discuss this with her husband and will let me know if she wishes to proceed. Risks and benefits were discussed in detail.          Cheyenne Gonzalez. Venetia Maxon, M.D./aft Cheyenne Gonzalez  #[id number] DOB:  December 31, 1950 12/26/2010:    [name] returned for followup with Dr. Venetia Maxon regarding cervical spine issues and she has developed some significant left shoulder adhesive capsulitis and rotator cuff tendonitis and bursitis and impingement syndrome.  She continues to have some left-sided neck pain as well.  Dr. Venetia Maxon feels she would benefit from a corticosteroid injection.  X-rays are obtained and I was asked to see the patient regarding consideration of this.  PHYSICAL EXAMINATION: She has some adhesive capsulitis.  She has good internal rotation but poor abduction and forward flexion.  She has good strength with stressing the cuff but with pain.  She has markedly positive impingement signs, some tenderness over the [facility] joint and some left-sided cervical spine muscular pain.  Radiographs show some subacromial spurring, no high riding humeral head, and no other osseous abnormalities.   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    PLAN:     After informed  consent, she elects to proceed with corticosteroid injection subacromially in the left shoulder.  Post injection she has improvement in her shoulder pain but still has some left neck musculature.  Using sterile technique she was anesthetized with ethyl chloride and injected with 1:3 Depo-Medrol/Marcaine subacromially into the left shoulder.  Ice for 24-48 hours.  Give this 3-4 weeks to see how she responds.  Followup with Dr. Venetia Maxon for other issues. She may benefit from some physical therapy to restore her motion as it is not normal.  I explained to her that when motion is not normal she will continue to have pain in the shoulder and neck musculature.  She would like to see how she does with the shot prior to seeing physical therapy at this point in time.    All questions were encouraged, answered and addressed.  The patient is seen today by Hardin Negus, PA-C in the office.          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  She previously had surgery by Dr. Berniece Pap, which consisted of a C5-6 and C6-7 laminectomy on the left for left arm pain. She had previously undergone L4-5 and L5-S1 laminectomy by Dr. Ian Bushman in 08/1992.  She says that following her surgery three months postoperatively she developed right arm symptoms. She says her left arm is actually better and has been better since the surgery. She describes pain into her right elbow and neck ranging from 8 to 9/10 in severity, and numbness in her right arm.  She says she had neck pain and swelling after returning to work in July with pinching in her right trapezius and right thumb. The pain wakes her up at night. She has been to physical therapy since March which ended without any significant relief.   She had one episode lasting about two days of bilateral foot and leg swelling, but otherwise no lower extremity symptoms.  She notes weakness in both arms and has been dropping things with her left hand prior to the March surgery.  She had nerve conduction studies which demonstrates borderline right ulnar neuropathy with some slowing across the elbow, but without any drop in amplitude across the elbow.  There is no evidence of denervation in her hand. This study was performed in July 2011.  She saw Dr. Berniece Pap back in July of 2011 to review her persistent right arm discomfort and he reviewed her MRI from November 2010.  He suggested she got for evaluation at the [facility] [place].  Dr. Berniece Pap is a neurosurgeon in [place].  She did not have a  postoperative MRI of her cervical spine.    REVIEW OF SYSTEMS:   A detailed Review of Systems sheet was reviewed with the patient.  Pertinent positives include wears glasses, sinus headache, swelling in feet or hands, arm weakness, arm pain, neck pain, and double or blurred vision.  All other systems are negative; this includes Constitutional symptoms, Endocrine, Respiratory, Gastrointestinal, Genitourinary, Integumentary & Breast, Psychiatric, Hematologic/Lymphatic, Allergic/Immunologic.    PAST MEDICAL HISTORY:      Current Medical Conditions:    She has a history of hypertension and her prescription just stopped by her primary physician.      Prior Operations and Hospitalizations:   As previously described.    Medications and Allergies:  The patient is ALLERGIC TO TEGRETOL.  Medications - Maxzide 25 mg. qd and Aspirin 81 mg. qd.      Height and Weight:     She is 5'5" tall, 186 lbs.    FAMILY HISTORY:    Her mother is 81 in good health.  Her father is deceased.  There is a family history of cancer.    SOCIAL HISTORY:    She is a nonsmoker, social drinker of alcoholic beverages, no history of substance abuse.    DIAGNOSTIC STUDIES:   I obtained  cervical radiographs today in the office which demonstrated right C5-6 and left C5-6 and C6-7 neural foraminal stenosis.    PHYSICAL EXAMINATION:      General Appearance:   On examination today, Mrs. Agosto is a pleasant and cooperative woman in no acute distress.      Blood Pressure, Pulse:     Her blood pressure is 118/72. Heart rate is 74 and regular.  Respiratory rate 18.      HEENT - normocephalic, atraumatic.  The pupils are equal, round and reactive to light.  The extraocular muscles are intact.  Sclerae - white.  Conjunctiva - pink.  Oropharynx benign.  Uvula midline.     Neck - there are no masses, meningismus, deformities, tracheal deviation, jugular vein distention or carotid bruits.  There is normal cervical range of motion.  She has a positive Spurlings' maneuver to the right, negative to the left.  A healed posterior cervical incision. Lhermitte's sign is not present with axial compression.      Respiratory - there is normal respiratory effort with good intercostal function.  Lungs are clear to auscultation.  There are no rales, rhonchi or wheezes.      Cardiovascular - the heart has regular rate and rhythm to auscultation.  No murmurs are appreciated.  There is no extremity edema, cyanosis or clubbing.  There are palpable pedal pulses.      Abdomen - soft, nontender, no hepatosplenomegaly appreciated or masses.  There are active bowel sounds.  No guarding or rebound.    Musculoskeletal Examination - A mildly positive Tinel's on the left elbow, negative on the right.    NEUROLOGICAL EXAMINATION: The patient is oriented to time, person and place and has good recall of both recent and remote memory with normal attention span and concentration.  The patient speaks with clear and fluent speech and exhibits normal language function and appropriate fund of knowledge.      Cranial Nerve Examination - pupils are equal, round and reactive to light.  Extraocular movements are full.  Visual  fields are full to confrontational testing.  Facial sensation and facial movement are symmetric and intact.  Hearing is intact to finger rub.  Palate is upgoing.  Shoulder shrug  is symmetric.  Tongue protrudes in the midline.      Motor Examination - motor strength is 5/5 in the bilateral deltoids, handgrips, wrist extensors, interosseous, 5/5 left biceps, 4/5 right biceps strength, 5/5 left triceps, and 4+/5 right triceps strength.  In the lower extremities motor strength is 5/5 in hip flexion, extension, quadriceps, hamstrings, plantar flexion, dorsiflexion and extensor hallucis longus.      Sensory Examination - she notes decreased pin sensation in the right first digit.       Deep Tendon Reflexes - 2 in the biceps, triceps, and brachioradialis, 2 in the knees, 2 in the ankles.  The great toes are downgoing to plantar stimulation.      Cerebellar Examination - normal coordination in upper and lower extremities and normal rapid alternating movements.  Romberg test is negative.    IMPRESSION AND RECOMMENDATIONS: Mireyah Albertini is a 61 year old woman with right arm pain and weakness involving C6 and possibly C7 nerve root.  She had cervical radiographs which demonstrate foraminal stenosis most marked at the C5-6 level on the right. I talked to her about treatment options. I would recommend we get a new MRI of her cervical spine. I believe that this will show significant foraminal stenosis at C5-6 on the right and questionably at C6-7, and I told her that surgery could be done anteriorly to adequately decompress the nerve roots, but I believe that because of the long-standing nature of her symptoms and without relief with conservative measures that surgical intervention may well be appropriate at this point. She will come back and see me after her MRI and I will make further recommendations at that point.      [facility]    Cheyenne Gonzalez. Venetia Maxon, M.D.

## 2011-12-24 NOTE — Anesthesia Preprocedure Evaluation (Addendum)
Anesthesia Evaluation  Patient identified by MRN, date of birth, ID band Patient awake    Reviewed: Allergy & Precautions, H&P , NPO status , Patient's Chart, lab work & pertinent test results  History of Anesthesia Complications (+) PONV  Airway Mallampati: I TM Distance: >3 FB Neck ROM: Full    Dental  (+) Teeth Intact and Chipped   Pulmonary          Cardiovascular hypertension, Pt. on medications     Neuro/Psych    GI/Hepatic   Endo/Other    Renal/GU      Musculoskeletal   Abdominal   Peds  Hematology   Anesthesia Other Findings   Reproductive/Obstetrics                          Anesthesia Physical Anesthesia Plan  ASA: II  Anesthesia Plan: General   Post-op Pain Management:    Induction: Intravenous  Airway Management Planned: Oral ETT  Additional Equipment:   Intra-op Plan:   Post-operative Plan: Extubation in OR  Informed Consent: I have reviewed the patients History and Physical, chart, labs and discussed the procedure including the risks, benefits and alternatives for the proposed anesthesia with the patient or authorized representative who has indicated his/her understanding and acceptance.     Plan Discussed with: CRNA and Surgeon  Anesthesia Plan Comments:         Anesthesia Quick Evaluation

## 2011-12-24 NOTE — Op Note (Signed)
12/24/2011  10:25 AM  PATIENT:  Cheyenne Gonzalez  61 y.o. 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, Cervical spondylosis C 4 -7  PROCEDURE:  Procedure(s) (LRB) with comments: POSTERIOR CERVICAL FUSION/FORAMINOTOMY LEVEL 3 (N/A) - Cervical four to Cervical seven osterior cervical Decompression/Fusion with lateral mass screws and posterolateral arthrodesis with autograft and allograft  SURGEON:  Surgeon(s) and Role:    * Xxavier Noon, MD - Primary    * Ernesto M Botero, MD - Assisting  PHYSICIAN ASSISTANT:   ASSISTANTS: Poteat, RN   ANESTHESIA:   general  EBL:  Total I/O In: 1000 [I.V.:1000] Out: -   BLOOD ADMINISTERED:none  DRAINS: none   LOCAL MEDICATIONS USED:  LIDOCAINE   SPECIMEN:  No Specimen  DISPOSITION OF SPECIMEN:  N/A  COUNTS:  YES  TOURNIQUET:  * No tourniquets in log *  DICTATION: Patient is 61 year old woman with neck pain and bilateral upper extremity pain and numbness, s/p ACDF C 56 and C 67 and before that posterior cervical laminectomies on the left at C 56 and C 67 with incomplete healing and chronic pain.  It was elected to take the patient to surgery for posterior cervical decompression and fusion.   PROCEDURE: Patient was brought to the OR and following smooth and uncomplicated induction of GETA, patient was placed in 3 pin fixation and rolled into a prone position on the OR table.  Shoulders were taped to facilitate Xray visualization. Posterior neck was prepped and draped in the usual fashion.  Area of planned incision was infiltrated with local lidocaine.  Incision was made from C4-C7 and carried through the avascular midline plane to expose these levels and their lateral masses.  There did not appear to be solid fusion at the previously operated levels.  Lateral mass  screws were placed from C4 to C7 bilaterally according to standard landmarks and their positioning was confirmed with fluoroscopy.  I was not able to place a screw at C 5 on the left due to the extent of prior bone removal. The previous laminectomy defects were cleared of investing scar tissue.  There did not appear to be evidence of residual nerve root compression. The facet joints and laminae were decorticated and packed with extra small BMP and NexOss bone graft extender and bone autograft which was morselized local bone removed from posterior decompression.  Screws and rods were locked down in situ.  Wound was closed with 0, 2-0, and 3-0 vicryl sutures.  Sterile occlusive dressing was placed.  Patient was taken out of pins and turned back onto the OR stretcher.   Patient was extubated in the operating room and taken to recovery in stable and satisfactory condition.  Counts were correct at the end of the case.   PLAN OF CARE: Admit for overnight observation  PATIENT DISPOSITION:  PACU - hemodynamically stable.   Delay start of Pharmacological VTE agent (>24hrs) due to surgical blood loss or risk of bleeding: yes  

## 2011-12-24 NOTE — Progress Notes (Signed)
Awake, alert, conversant.  MAEW with good strength.  Doing well. 

## 2011-12-24 NOTE — Preoperative (Signed)
Beta Blockers   Reason not to administer Beta Blockers:Not Applicable 

## 2011-12-24 NOTE — Transfer of Care (Signed)
Immediate Anesthesia Transfer of Care Note  Patient: Cheyenne Gonzalez  Procedure(s) Performed: Procedure(s) (LRB) with comments: POSTERIOR CERVICAL FUSION/FORAMINOTOMY LEVEL 3 (N/A) - Cervical four to Cervical seven osterior cervical Decompression/Fusion  Patient Location: PACU  Anesthesia Type: General  Level of Consciousness: awake, alert  and oriented  Airway & Oxygen Therapy: Patient Spontanous Breathing and Patient connected to nasal cannula oxygen  Post-op Assessment: Report given to PACU RN, Post -op Vital signs reviewed and stable and Patient moving all extremities X 4  Post vital signs: Reviewed and stable  Complications: No apparent anesthesia complications

## 2011-12-24 NOTE — Interval H&P Note (Signed)
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 After consideration of risks, benefits and other options for treatment, the patient has consented to  Procedure(s) (LRB) with comments: POSTERIOR CERVICAL FUSION/FORAMINOTOMY LEVEL 3 (N/A) - C4 to C7 Posterior cervical Decompression/Fusion as a surgical intervention .  The patient's history has been reviewed, patient examined, no change in status, stable for surgery.  I have reviewed the patient's chart and labs.  Questions were answered to the patient's satisfaction.     Cheyenne Gonzalez D  EMG and NCV performed and reviewed, which show no electrical abnormalities in the upper extremities without evidence of peripheral Median or Ulnar neuropathies or entrapment neuropathies of any kind.  Plan is to proceed with posterior cervical decompression and fusion C 4-7 levels.

## 2011-12-24 NOTE — Brief Op Note (Signed)
12/24/2011  10:25 AM  PATIENT:  Cheyenne Gonzalez  61 y.o. 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, Cervical spondylosis C 4 -7  PROCEDURE:  Procedure(s) (LRB) with comments: POSTERIOR CERVICAL FUSION/FORAMINOTOMY LEVEL 3 (N/A) - Cervical four to Cervical seven osterior cervical Decompression/Fusion with lateral mass screws and posterolateral arthrodesis with autograft and allograft  SURGEON:  Surgeon(s) and Role:    * Maeola Harman, MD - Primary    * Karn Cassis, MD - Assisting  PHYSICIAN ASSISTANT:   ASSISTANTS: Poteat, RN   ANESTHESIA:   general  EBL:  Total I/O In: 1000 [I.V.:1000] Out: -   BLOOD ADMINISTERED:none  DRAINS: none   LOCAL MEDICATIONS USED:  LIDOCAINE   SPECIMEN:  No Specimen  DISPOSITION OF SPECIMEN:  N/A  COUNTS:  YES  TOURNIQUET:  * No tourniquets in log *  DICTATION: Patient is 61 year old woman with neck pain and bilateral upper extremity pain and numbness, s/p ACDF C 56 and C 67 and before that posterior cervical laminectomies on the left at C 56 and C 67 with incomplete healing and chronic pain.  It was elected to take the patient to surgery for posterior cervical decompression and fusion.   PROCEDURE: Patient was brought to the OR and following smooth and uncomplicated induction of GETA, patient was placed in 3 pin fixation and rolled into a prone position on the OR table.  Shoulders were taped to facilitate Xray visualization. Posterior neck was prepped and draped in the usual fashion.  Area of planned incision was infiltrated with local lidocaine.  Incision was made from C4-C7 and carried through the avascular midline plane to expose these levels and their lateral masses.  There did not appear to be solid fusion at the previously operated levels.  Lateral mass  screws were placed from C4 to C7 bilaterally according to standard landmarks and their positioning was confirmed with fluoroscopy.  I was not able to place a screw at C 5 on the left due to the extent of prior bone removal. The previous laminectomy defects were cleared of investing scar tissue.  There did not appear to be evidence of residual nerve root compression. The facet joints and laminae were decorticated and packed with extra small BMP and NexOss bone graft extender and bone autograft which was morselized local bone removed from posterior decompression.  Screws and rods were locked down in situ.  Wound was closed with 0, 2-0, and 3-0 vicryl sutures.  Sterile occlusive dressing was placed.  Patient was taken out of pins and turned back onto the OR stretcher.   Patient was extubated in the operating room and taken to recovery in stable and satisfactory condition.  Counts were correct at the end of the case.   PLAN OF CARE: Admit for overnight observation  PATIENT DISPOSITION:  PACU - hemodynamically stable.   Delay start of Pharmacological VTE agent (>24hrs) due to surgical blood loss or risk of bleeding: yes

## 2011-12-24 NOTE — Progress Notes (Signed)
As above.

## 2011-12-24 NOTE — Anesthesia Postprocedure Evaluation (Signed)
Anesthesia Post Note  Patient: Cheyenne Gonzalez  Procedure(s) Performed: Procedure(s) (LRB): POSTERIOR CERVICAL FUSION/FORAMINOTOMY LEVEL 3 (N/A)  Anesthesia type: general  Patient location: PACU  Post pain: Pain level controlled  Post assessment: Patient's Cardiovascular Status Stable  Last Vitals:  Filed Vitals:   12/24/11 1145  BP: 154/84  Pulse: 41  Temp:   Resp: 20    Post vital signs: Reviewed and stable  Level of consciousness: sedated  Complications: No apparent anesthesia complications

## 2011-12-24 NOTE — Progress Notes (Signed)
Patient ID: Cheyenne Gonzalez, female   DOB: [birth year], 61 y.o.   MRN: [id number]  Alert, assisted with soup by her husband. MAEW. Pain controlled at present (shoulders only) - reassured. Drsg intact. Vista collar in use.  Georgiann Cocker RN, BSN

## 2011-12-25 NOTE — Evaluation (Signed)
Physical Therapy Evaluation Patient Details Name: Cheyenne Gonzalez MRN: [id number] DOB: 07/27/1950 Today's Date: 12/25/2011 Time: 0830-0900 PT Time Calculation (min): 30 min  PT Assessment / Plan / Recommendation Clinical Impression  Pt is a 61 y/o female admitted s/p C4-7 fusion along with the below PT problem list. Pt would benefit from acute PT to maximize independence and facilitate d/c home with hopefully no follow-up PT needs.    PT Assessment  Patient needs continued PT services    Follow Up Recommendations  No PT follow up    Does the patient have the potential to tolerate intense rehabilitation      Barriers to Discharge None      Equipment Recommendations  None recommended by PT    Recommendations for Other Services     Frequency Min 5X/week    Precautions / Restrictions Precautions Precautions: Cervical;Fall Precaution Comments: Educated on 3/3 cervical precautions. Required Braces or Orthoses: Cervical Brace Cervical Brace: Hard collar;Applied in supine position Restrictions Weight Bearing Restrictions: No   Pertinent Vitals/Pain 8/10 in posterior neck. RN aware.      Mobility  Bed Mobility Bed Mobility: Rolling Left;Left Sidelying to Sit;Sit to Sidelying Left;Rolling Right Rolling Right: 5: Supervision;With rail Rolling Left: 5: Supervision;With rail Left Sidelying to Sit: 4: Min assist;HOB flat;With rails Sit to Sidelying Left: 4: Min assist;With rail;HOB flat Details for Bed Mobility Assistance: Verbal cues for safe sequence to protect neck. Assist to translate trunk to midline and slow descent to bed. Transfers Transfers: Sit to Stand;Stand to Sit (2 trials.) Sit to Stand: 4: Min assist;With upper extremity assist;From bed;From chair/3-in-1 Stand to Sit: 4: Min assist;With upper extremity assist;To chair/3-in-1;To bed Details for Transfer Assistance: Assist for balance with cues for safest hand placement and sequence. Ambulation/Gait Ambulation/Gait  Assistance: 4: Min assist Ambulation Distance (Feet): 80 Feet (40 feet x 2 trials with seated rest break.) Assistive device: 1 person hand held assist Ambulation/Gait Assistance Details: Assist for balance with distance limited by pain. Cues for tall posture and safety. Gait Pattern: Step-through pattern;Decreased stride length;Shuffle Stairs: No Wheelchair Mobility Wheelchair Mobility: No    Shoulder Instructions     Exercises     PT Diagnosis: Difficulty walking;Acute pain  PT Problem List: Decreased strength;Decreased activity tolerance;Decreased balance;Decreased mobility;Pain PT Treatment Interventions: Gait training;Stair training;Functional mobility training;Therapeutic activities;Balance training;Patient/family education   PT Goals Acute Rehab PT Goals PT Goal Formulation: With patient/family Time For Goal Achievement: 01/01/12 Potential to Achieve Goals: Good Pt will Roll Supine to Right Side: with modified independence PT Goal: Rolling Supine to Right Side - Progress: Goal set today Pt will Roll Supine to Left Side: with modified independence PT Goal: Rolling Supine to Left Side - Progress: Goal set today Pt will go Supine/Side to Sit: with modified independence PT Goal: Supine/Side to Sit - Progress: Goal set today Pt will go Sit to Supine/Side: with modified independence PT Goal: Sit to Supine/Side - Progress: Goal set today Pt will go Sit to Stand: with modified independence PT Goal: Sit to Stand - Progress: Goal set today Pt will go Stand to Sit: with modified independence PT Goal: Stand to Sit - Progress: Goal set today Pt will Ambulate: >150 feet;with modified independence;with least restrictive assistive device PT Goal: Ambulate - Progress: Goal set today Pt will Go Up / Down Stairs: 1-2 stairs;with min assist;with least restrictive assistive device PT Goal: Up/Down Stairs - Progress: Goal set today  Visit Information  Last PT Received On: 12/25/11 Assistance  Needed: +1  Subjective Data  Subjective: "It just is hurting a lot." Patient Stated Goal: Decrease pain.   Prior Functioning  Home Living Lives With: Spouse Available Help at Discharge: Family;Available PRN/intermittently Type of Home: House Home Access: Stairs to enter [employer] of Steps: 1 Entrance Stairs-Rails: None Home Layout: One level Home Adaptive Equipment: None Prior Function Level of Independence: Independent Able to Take Stairs?: Yes Driving: Yes Communication Communication: No difficulties Dominant Hand: Right    Cognition  Overall Cognitive Status: Appears within functional limits for tasks assessed/performed Arousal/Alertness: Awake/alert Orientation Level: Appears intact for tasks assessed Behavior During Session: [facility] for tasks performed    Extremity/Trunk Assessment Right Upper Extremity Assessment RUE ROM/Strength/Tone:  (Defer to OT.) Left Upper Extremity Assessment LUE ROM/Strength/Tone:  (Defer to OT.) Right Lower Extremity Assessment RLE ROM/Strength/Tone: Deficits RLE ROM/Strength/Tone Deficits: 4/5 RLE Sensation: WFL - Light Touch RLE Coordination: WFL - gross motor Left Lower Extremity Assessment LLE ROM/Strength/Tone: Deficits LLE ROM/Strength/Tone Deficits: 4/5 LLE Sensation: WFL - Light Touch LLE Coordination: WFL - gross motor Trunk Assessment Trunk Assessment: Normal   Balance Balance Balance Assessed: No  End of Session PT - End of Session Equipment Utilized During Treatment: Gait belt;Cervical collar Activity Tolerance: Patient limited by pain Patient left: in bed;with call bell/phone within reach;with family/visitor present Nurse Communication: Mobility status;Patient requests pain meds  GP Functional Assessment Tool Used: Clinical Judgement. Functional Limitation: Mobility: Walking and moving around Mobility: Walking and Moving Around Current Status [phone]): At least 1 percent but less than 20 percent impaired,  limited or restricted Mobility: Walking and Moving Around Goal Status [id number]): 0 percent impaired, limited or restricted   Cephus Shelling 12/25/2011, 9:29 AM  12/25/2011 Cephus Shelling, PT, DPT [phone]

## 2011-12-25 NOTE — Evaluation (Signed)
Occupational Therapy Evaluation Patient Details Name: Cheyenne Gonzalez MRN: [id number] DOB: [birth year] Today's Date: 12/25/2011 Time: 4098-1191 OT Time Calculation (min): 30 min  OT Assessment / Plan / Recommendation Clinical Impression  Pt s/p C4-7 fusion thus affecting PLOF. Will benefit from acute OT services to address below problem list in prep for d/c home.    OT Assessment  Patient needs continued OT Services    Follow Up Recommendations  No OT follow up    Barriers to Discharge      Equipment Recommendations  None recommended by OT    Recommendations for Other Services    Frequency  Min 2X/week    Precautions / Restrictions Precautions Precautions: Cervical;Fall Precaution Comments: Educated on 3/3 cervical precautions. Required Braces or Orthoses: Cervical Brace Cervical Brace: Hard collar;Applied in supine position Restrictions Weight Bearing Restrictions: No   Pertinent Vitals/Pain See vitals    ADL  Upper Body Dressing: Performed;Modified independent Where Assessed - Upper Body Dressing: Unsupported sitting Lower Body Dressing: Performed;Minimal assistance Where Assessed - Lower Body Dressing: Unsupported sitting Toilet Transfer: Simulated;Minimal assistance Toilet Transfer Method: Sit to stand Toilet Transfer Equipment:  (bed to ambulate in hallway, then returned to bed) Equipment Used: Gait belt (cervical collar) Transfers/Ambulation Related to ADLs: Min assist for ambulation.  Pt ambulated ~80 ft with need for seated rest break due to neck pain.  ADL Comments: Pt reports she had cervical collar from last sx and was able to verbalize correct positioning as well as donning/doffing technique.  Pt required increased time to don robe due to neck pain.  Able to cross ankles over knees but unable to reach forward for sock due to pain.    OT Diagnosis: Acute pain;Generalized weakness  OT Problem List: Decreased activity tolerance;Pain;Decreased knowledge of  precautions;Impaired UE functional use OT Treatment Interventions: Self-care/ADL training;DME and/or AE instruction;Therapeutic activities;Patient/family education   OT Goals Acute Rehab OT Goals OT Goal Formulation: With patient Time For Goal Achievement: 01/01/12 Potential to Achieve Goals: Good ADL Goals Pt Will Perform Grooming: with modified independence;Standing at sink (maintaining cervical precautions) ADL Goal: Grooming - Progress: Goal set today Pt Will Perform Lower Body Dressing: with modified independence;Sit to stand from chair;Sit to stand from bed;Unsupported ADL Goal: Lower Body Dressing - Progress: Goal set today Pt Will Transfer to Toilet: Ambulation;with supervision;Regular height toilet ADL Goal: Toilet Transfer - Progress: Goal set today Pt Will Perform Toileting - Clothing Manipulation: with modified independence;Sitting on 3-in-1 or toilet;Standing ADL Goal: Toileting - Clothing Manipulation - Progress: Goal set today Pt Will Perform Toileting - Hygiene: with modified independence;Sit to stand from 3-in-1/toilet;Standing at 3-in-1/toilet ADL Goal: Toileting - Hygiene - Progress: Goal set today Pt Will Perform Tub/Shower Transfer: Tub transfer;with supervision;Ambulation ADL Goal: Tub/Shower Transfer - Progress: Goal set today  Visit Information  Last OT Received On: 12/25/11 Assistance Needed: +1    Subjective Data      Prior Functioning     Home Living Lives With: Spouse Available Help at Discharge: Family;Available PRN/intermittently Type of Home: House Home Access: Stairs to enter [employer] of Steps: 1 Entrance Stairs-Rails: None Home Layout: One level Bathroom Shower/Tub: Network engineer: None Prior Function Level of Independence: Independent Able to Take Stairs?: Yes Driving: Yes Communication Communication: No difficulties Dominant Hand: Right         Vision/Perception      Cognition  Overall Cognitive Status: Appears within functional limits for tasks assessed/performed Arousal/Alertness: Awake/alert Orientation Level: Appears intact for tasks  assessed Behavior During Session: [facility] for tasks performed    Extremity/Trunk Assessment Right Upper Extremity Assessment RUE ROM/Strength/Tone: Deficits;Unable to fully assess;Due to precautions;Due to pain RUE ROM/Strength/Tone Deficits: Grip 3/5 - 3+/5.  Minimum of 3+/5 throughout elbow and shoulder but unable to fully test due to pain and cervical precautions. RUE Sensation: Deficits RUE Sensation Deficits: numbness RUE Coordination: WFL - gross/fine motor Left Upper Extremity Assessment LUE ROM/Strength/Tone: Deficits;Unable to fully assess;Due to pain;Due to precautions LUE ROM/Strength/Tone Deficits: Grip 3/5 - 3+/5.  Minimum of 3+/5 throughout elbow and shoulder but unable to fully test due to pain and cervical precautions. LUE Sensation: Deficits LUE Sensation Deficits: deficits LUE Coordination: WFL - gross/fine motor Right Lower Extremity Assessment RLE ROM/Strength/Tone: Deficits RLE ROM/Strength/Tone Deficits: 4/5 RLE Sensation: WFL - Light Touch RLE Coordination: WFL - gross motor Left Lower Extremity Assessment LLE ROM/Strength/Tone: Deficits LLE ROM/Strength/Tone Deficits: 4/5 LLE Sensation: WFL - Light Touch LLE Coordination: WFL - gross motor Trunk Assessment Trunk Assessment: Normal     Mobility Bed Mobility Bed Mobility: Rolling Left;Left Sidelying to Sit;Sit to Sidelying Left;Rolling Right Rolling Right: 5: Supervision;With rail Rolling Left: 5: Supervision;With rail Left Sidelying to Sit: 4: Min assist;HOB flat;With rails Sit to Sidelying Left: 4: Min assist;With rail;HOB flat Details for Bed Mobility Assistance: Verbal cues for safe sequence to protect neck. Assist to translate trunk to midline and slow descent to bed. Transfers Sit to Stand: 4: Min assist;With upper extremity  assist;From bed;From chair/3-in-1 Stand to Sit: 4: Min assist;With upper extremity assist;To chair/3-in-1;To bed Details for Transfer Assistance: Assist for balance with cues for safest hand placement and sequence.     Shoulder Instructions     Exercise     Balance Balance Balance Assessed: No   End of Session OT - End of Session Equipment Utilized During Treatment: Gait belt;Cervical collar Activity Tolerance: Patient limited by pain Patient left: in bed;with call bell/phone within reach;with family/visitor present Nurse Communication: Mobility status;Patient requests pain meds  GO Functional Assessment Tool Used: clinical judgement Functional Limitation: Self care Self Care Current Status (Z6109): At least 20 percent but less than 40 percent impaired, limited or restricted Self Care Goal Status (U0454): At least 1 percent but less than 20 percent impaired, limited or restricted  12/25/2011 Cipriano Mile OTR/L Pager [phone] Office [phone]  Cipriano Mile 12/25/2011, 1:11 PM

## 2011-12-25 NOTE — Progress Notes (Signed)
Subjective: Patient reports pain, "Just the back of my neck"   Objective: Vital signs in last 24 hours: Temp:  [96.8 F (36 C)-98.6 F (37 C)] 98.6 F (37 C) (10/09 0400) Pulse Rate:  [38-81] 80  (10/09 0400) Resp:  [11-20] 16  (10/09 0400) BP: (108-154)/(61-84) 108/61 mmHg (10/09 0400) SpO2:  [92 %-100 %] 95 % (10/09 0400)  Intake/Output from previous day: 10/08 0701 - 10/09 0700 In: 1200 [I.V.:1200] Out: 75 [Blood:75] Intake/Output this shift:    Alert, conversant. Reports persistent incisional pain - reassured. Drsg intact with old blood - changed. Incision without erythema, swelling, or active bleeding. Good strength BUE - limited by pain.   Lab Results: No results found for this basename: WBC:2,HGB:2,HCT:2,PLT:2 in the last 72 hours BMET No results found for this basename: NA:2,K:2,CL:2,CO2:2,GLUCOSE:2,BUN:2,CREATININE:2,CALCIUM:2 in the last 72 hours  Studies/Results: Dg Cervical Spine 1 View  12/24/2011  *RADIOLOGY REPORT*  Clinical Data: C4-C7 posterior fusion.  DG C-ARM 1-60 MIN,DG CERVICAL SPINE - 1 VIEW  Comparison:  02/27/2011 myelogram.  Findings: Single intraoperative C-arm view reveals prior anterior fusion C5 and inferiorly with new posterior fusion C4 and inferiorly.  Below the C4 level, evaluation is limited by patient's shoulders and can be evaluated on follow-up.  IMPRESSION: Single intraoperative C-arm view reveals prior anterior fusion C5 and inferiorly with new posterior fusion C4 and inferiorly.  Below the C4 level, evaluation is limited by patient's shoulders and can be evaluated on follow-up.   Original Report Authenticated By: Fuller Canada, M.D.    Dg C-arm 1-60 Min  12/24/2011  *RADIOLOGY REPORT*  Clinical Data: C4-C7 posterior fusion.  DG C-ARM 1-60 MIN,DG CERVICAL SPINE - 1 VIEW  Comparison:  02/27/2011 myelogram.  Findings: Single intraoperative C-arm view reveals prior anterior fusion C5 and inferiorly with new posterior fusion C4 and inferiorly.   Below the C4 level, evaluation is limited by patient's shoulders and can be evaluated on follow-up.  IMPRESSION: Single intraoperative C-arm view reveals prior anterior fusion C5 and inferiorly with new posterior fusion C4 and inferiorly.  Below the C4 level, evaluation is limited by patient's shoulders and can be evaluated on follow-up.   Original Report Authenticated By: Fuller Canada, M.D.     Assessment/Plan: Pain as expected 1 day post-op   LOS: 1 day  Continue to mobilize in [facility]. Pain control today.   Georgiann Cocker 12/25/2011, 7:32 AM

## 2011-12-25 NOTE — Progress Notes (Signed)
Coming along well with improved numbness, but sore in neck (expected)

## 2011-12-25 NOTE — Progress Notes (Signed)
Referral received for SNF. Chart reviewed and CSW has spoken with RN who indicates that patient is for DC to home with no HH/DME needs anticipated.   CSW to sign off. Please re-consult if CSW needs arise.  Lorri Frederick. [place]  [phone]

## 2011-12-25 NOTE — Progress Notes (Signed)
UR COMPLETED  

## 2011-12-26 MED ORDER — HYDROMORPHONE HCL 2 MG PO TABS
2.0000 mg | ORAL_TABLET | ORAL | Status: DC | PRN
  Administered 2011-12-26: 4 mg via ORAL
  Administered 2011-12-26: 2 mg via ORAL
  Administered 2011-12-26 – 2011-12-27 (×2): 4 mg via ORAL
  Filled 2011-12-26 (×3): qty 2
  Filled 2011-12-26: qty 1

## 2011-12-26 NOTE — Progress Notes (Signed)
Physical Therapy Treatment Patient Details Name: Cheyenne Gonzalez MRN: [id number] DOB: 11/08/1950 Today's Date: 12/26/2011 Time: 4098-1191 PT Time Calculation (min): 41 min  PT Assessment / Plan / Recommendation Comments on Treatment Session  Pt s/p C4-7 fusion limited by neck and left shoulder pain stating gait slowed due to feeling every step with neck pain. Pt educated for precautions transfers and mobility and encouraged to increase ambulation, trials sitting in chair, and to relax arms with gait rather than maintaining them crossed. Will continue to follow and with pt slow progression feel she would benefit from HHPT.     Follow Up Recommendations  Home health PT     Does the patient have the potential to tolerate intense rehabilitation     Barriers to Discharge        Equipment Recommendations  None recommended by PT    Recommendations for Other Services    Frequency     Plan Discharge plan needs to be updated;Frequency remains appropriate    Precautions / Restrictions Precautions Precautions: Cervical;Fall Precaution Comments: pt educated on cervical precautions and pt and spouse state neuro RN told them ok for overhead activity Required Braces or Orthoses: Cervical Brace   Pertinent Vitals/Pain 8/10 left shoulder and neck pain throughout Premedicated and repositioned    Mobility  Bed Mobility Bed Mobility: Rolling Left;Left Sidelying to Sit Rolling Left: 5: Supervision Left Sidelying to Sit: 4: Min assist;HOB flat Details for Bed Mobility Assistance: cueing for sequence, precautions and technique with assist to elevate trunk from surface Transfers Sit to Stand: 4: Min guard;From bed Stand to Sit: 5: Supervision;To chair/3-in-1 Details for Transfer Assistance: cueing for posture  Ambulation/Gait Ambulation/Gait Assistance: 4: Min guard Ambulation Distance (Feet): 150 Feet Assistive device: None Ambulation/Gait Assistance Details: Pt maintaining bil arms crossed  holding them at chest the entire time due to pain with very slow gait Gait Pattern: Decreased stride length;Shuffle Gait velocity: 70ft/140sec= 0.14 ft/sec placing pt at increased fall risk and decreased functional capacity Stairs: Yes Stairs Assistance: 5: Supervision Stairs Assistance Details (indicate cue type and reason): supervision for safety Stair Management Technique: One rail Right Number of Stairs: 1     Exercises     PT Diagnosis:    PT Problem List:   PT Treatment Interventions:     PT Goals Acute Rehab PT Goals PT Goal: Rolling Supine to Left Side - Progress: Progressing toward goal PT Goal: Supine/Side to Sit - Progress: Progressing toward goal PT Goal: Sit to Stand - Progress: Progressing toward goal PT Goal: Stand to Sit - Progress: Progressing toward goal PT Goal: Ambulate - Progress: Progressing toward goal PT Goal: Up/Down Stairs - Progress: Met  Visit Information  Last PT Received On: 12/26/11 Assistance Needed: +1    Subjective Data  Subjective: It just still hurts so much   Cognition  Overall Cognitive Status: Appears within functional limits for tasks assessed/performed Arousal/Alertness: Awake/alert Orientation Level: Appears intact for tasks assessed Behavior During Session: [facility] for tasks performed    Balance     End of Session PT - End of Session Equipment Utilized During Treatment: Cervical collar Activity Tolerance: Patient limited by pain Patient left: in chair;with call bell/phone within reach;with family/visitor present Nurse Communication: Mobility status   GP     Toney Sang Beth 12/26/2011, 9:22 AM Delaney Meigs, PT [phone]

## 2011-12-26 NOTE — Progress Notes (Signed)
Occupational Therapy Treatment Patient Details Name: Cheyenne Gonzalez MRN: [id number] DOB: [birth year] Today's Date: 12/26/2011 Time: 4010-2725 OT Time Calculation (min): 24 min  OT Assessment / Plan / Recommendation Comments on Treatment Session Pt progressing with therapy. Session focused on AE education, shower transfer and DME needs.     Follow Up Recommendations  No OT follow up;Supervision/Assistance - 24 hour    Barriers to Discharge       Equipment Recommendations  None recommended by PT;None recommended by OT    Recommendations for Other Services    Frequency     Plan Discharge plan remains appropriate    Precautions / Restrictions Precautions Precautions: Cervical;Fall Precaution Comments: pt educated on cervical precautions  Required Braces or Orthoses: Cervical Brace Cervical Brace: Hard collar;Applied in supine position Restrictions Weight Bearing Restrictions: No   Pertinent Vitals/Pain Pt reports 4/10 back pain    ADL  Upper Body Bathing: Simulated;Supervision/safety Where Assessed - Upper Body Bathing: Unsupported standing Lower Body Bathing: Simulated;Min guard Where Assessed - Lower Body Bathing: Unsupported standing Toilet Transfer: Buyer, retail Method: Sit to Barista: Regular height toilet;Grab bars Toileting - Clothing Manipulation and Hygiene: Simulated;Minimal assistance Where Assessed - Engineer, mining and Hygiene: Standing Tub/Shower Transfer: Landscape architect Method: Science writer: Walk in Scientist, research (physical sciences) Used: Gait belt Transfers/Ambulation Related to ADLs: Supervision/Min guard A with ambulation throughout room and hallway; slow gait speed but no LOB ADL Comments: Educated pt and husband on AE. Encouraged use of 3n1 (husband states he feels they have one at home) for over toilet and in shower use. Also, encouraged pt to  continue to attempt crossing ankles over knees as tolerated to inc I with ADLs.     OT Diagnosis:    OT Problem List:   OT Treatment Interventions:     OT Goals ADL Goals ADL Goal: Lower Body Dressing - Progress: Progressing toward goals ADL Goal: Toilet Transfer - Progress: Progressing toward goals ADL Goal: Toileting - Clothing Manipulation - Progress: Progressing toward goals ADL Goal: Toileting - Hygiene - Progress: Progressing toward goals ADL Goal: Tub/Shower Transfer - Progress: Progressing toward goals  Visit Information  Last OT Received On: 12/26/11 Assistance Needed: +1    Subjective Data      Prior Functioning       Cognition  Overall Cognitive Status: Appears within functional limits for tasks assessed/performed Arousal/Alertness: Awake/alert Orientation Level: Appears intact for tasks assessed Behavior During Session: [facility] for tasks performed    Mobility  Shoulder Instructions Transfers Sit to Stand: 5: Supervision;With armrests;From chair/3-in-1 Stand to Sit: 6: Modified independent (Device/Increase time);With armrests;To chair/3-in-1 Details for Transfer Assistance: cues for pillow placement to ensure proper posture/maintain precautions       Exercises      Balance     End of Session OT - End of Session Equipment Utilized During Treatment: Gait belt;Cervical collar Activity Tolerance: Patient limited by fatigue;Patient limited by pain Patient left: in chair;with call bell/phone within reach;with family/visitor present Nurse Communication: Mobility status  GO     Blu Lori 12/26/2011, 2:10 PM

## 2011-12-26 NOTE — Progress Notes (Signed)
Will try dilaudid for pain management

## 2011-12-26 NOTE — Progress Notes (Signed)
Patient ID: Cheyenne Gonzalez, female   DOB: [birth year], 60 y.o.   MRN: [id number]  Awake, up in chair. Husband present. Bilat trapezius pain persists. Sore throat limited breakfast intake. Anxiety re: going home with significant pain. Pt notes Valium provides rapid relief after activity, but overall pain control is discouraging. Discussed with Dr. Venetia Maxon; orderes rec'd for Dilaudid 2-4mg  po q4hrs prn pain in hopes of better pain control. Order entered.  Still hopeful of d/c to home this afternoon, but only if pain control improved.   Georgiann Cocker, RN, BSN

## 2011-12-26 NOTE — Progress Notes (Signed)
Patient ID: Cheyenne Gonzalez, female   DOB: 12-05-1950, 61 y.o.   MRN: [id number] Alert, just back from walking in hallway. Reports improved pain control with Dilaudid. Plan to d/c to home in am if she does well through the night.  Georgiann Cocker, RN, BSN

## 2011-12-26 NOTE — Progress Notes (Signed)
As above.

## 2011-12-26 NOTE — Progress Notes (Signed)
Subjective: Patient reports "I just get really sore in this (left)shoulder when I pull up"  Objective: Vital signs in last 24 hours: Temp:  [98.5 F (36.9 C)-99.1 F (37.3 C)] 99.1 F (37.3 C) (10/10 0350) Pulse Rate:  [50-82] 68  (10/10 0350) Resp:  [14-16] 16  (10/10 0350) BP: (101-119)/(57-68) 119/68 mmHg (10/10 0350) SpO2:  [96 %-100 %] 100 % (10/10 0350)  Intake/Output from previous day: 10/09 0701 - 10/10 0700 In: 240 [P.O.:240] Out: -  Intake/Output this shift:    Alert, conversant. Persistent posterior neck and shoulder pain - reassured as expected x6-8weeks. Good strength BUE. Drsg intact, dry. Vista Collar in use. Pt reports she ambulated twice yesterday.  Lab Results: No results found for this basename: WBC:2,HGB:2,HCT:2,PLT:2 in the last 72 hours BMET No results found for this basename: NA:2,K:2,CL:2,CO2:2,GLUCOSE:2,BUN:2,CREATININE:2,CALCIUM:2 in the last 72 hours  Studies/Results: Dg Cervical Spine 1 View  12/24/2011  *RADIOLOGY REPORT*  Clinical Data: C4-C7 posterior fusion.  DG C-ARM 1-60 MIN,DG CERVICAL SPINE - 1 VIEW  Comparison:  02/27/2011 myelogram.  Findings: Single intraoperative C-arm view reveals prior anterior fusion C5 and inferiorly with new posterior fusion C4 and inferiorly.  Below the C4 level, evaluation is limited by patient's shoulders and can be evaluated on follow-up.  IMPRESSION: Single intraoperative C-arm view reveals prior anterior fusion C5 and inferiorly with new posterior fusion C4 and inferiorly.  Below the C4 level, evaluation is limited by patient's shoulders and can be evaluated on follow-up.   Original Report Authenticated By: Fuller Canada, M.D.    Dg C-arm 1-60 Min  12/24/2011  *RADIOLOGY REPORT*  Clinical Data: C4-C7 posterior fusion.  DG C-ARM 1-60 MIN,DG CERVICAL SPINE - 1 VIEW  Comparison:  02/27/2011 myelogram.  Findings: Single intraoperative C-arm view reveals prior anterior fusion C5 and inferiorly with new posterior fusion  C4 and inferiorly.  Below the C4 level, evaluation is limited by patient's shoulders and can be evaluated on follow-up.  IMPRESSION: Single intraoperative C-arm view reveals prior anterior fusion C5 and inferiorly with new posterior fusion C4 and inferiorly.  Below the C4 level, evaluation is limited by patient's shoulders and can be evaluated on follow-up.   Original Report Authenticated By: Fuller Canada, M.D.     Assessment/Plan: Improving slowly, anxious NF:AOZH.  LOS: 2 days  Encourage increased mobility, deep breathing, coughing. Plan for d/c to home later today. Rx's to chart: Percocet 5/325 1-2 po q4hiours prn pain #60; Valium 5mg  1-2 po q8 hours prn spasm #50.    Georgiann Cocker 12/26/2011, 7:43 AM

## 2011-12-26 NOTE — Care Management Note (Signed)
    Page 1 of 1   12/26/2011     2:00:25 PM   CARE MANAGEMENT NOTE 12/26/2011  Patient:  Cheyenne Gonzalez, Cheyenne Gonzalez   Account Number:  [id number]  Date Initiated:  12/26/2011  Documentation initiated by:  Anette Guarneri  Subjective/Objective Assessment:   POD#1 s/p ACDF     Action/Plan:   home with [facility] services   Anticipated DC Date:  12/26/2011   Anticipated DC Plan:  HOME W HOME HEALTH SERVICES      DC Planning Services  CM consult      Choice offered to / List presented to:  C-1 Patient        HH arranged  HH-2 PT      Status of service:  Completed, signed off Medicare Important Message given?  NO (If response is "NO", the following Medicare IM given date fields will be blank) Date Medicare IM given:   Date Additional Medicare IM given:    Discharge Disposition:  HOME W HOME HEALTH SERVICES  Per UR Regulation:  Reviewed for med. necessity/level of care/duration of stay  If discussed at Long Length of Stay Meetings, dates discussed:    Comments:  12/26/11  09:00 Anette Guarneri RN/CM HHPT ordered, spoke with patient regarding d/c needs, patient choice for [facility] services is [facility] contacted [facility] ph# [phone], arranged for HHPT, faxed to [phone] facesheet/MD orders/Op report.

## 2011-12-26 NOTE — Discharge Summary (Signed)
Physician Discharge Summary  Patient ID: Cheyenne Gonzalez MRN: [id number] DOB/AGE: January 25, 1951 61 y.o.  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 *    Discharged Condition: good  Hospital Course: Cheyenne Gonzalez was admitted 12-24-11 with dx cervical radiculopathy and spondylosis for posterior cervical fusion. Following uncomplicated surgery (Cervical four to Cervical seven posterior cervical Decompression/Fusion with lateral mass screws and posterolateral arthrodesis with autograft and allograft), pt recovered nicely in Neuro PACU before transfer to 3500 for nursing and therapy care.     Consults: None  Significant Diagnostic Studies: radiology: X-Ray: intra-operative  Treatments: surgery: Cervical four to Cervical seven osterior cervical Decompression/Fusion with lateral mass screws and posterolateral arthrodesis with autograft and allograft   Discharge Exam: Blood pressure 108/77, pulse 89, temperature 99.8 F (37.7 C), temperature source Oral, resp. rate 16, SpO2 93.00%. Alert, conversant. Neck pain "much better" than yesterday. Good strength BUE. Drsg intact - ok to remove at home. Vista Collar in use. Encouraged mobility and deep breathing for T 99.8.    Disposition: 01-Home or Self Care  Rx's: Dilaudid 2mg  1-2po q4hours prn pain #60. Percocet 5/325 1-2 po q4hiours prn pain #60 for use when Dilaudid no longer needed; Valium 5mg  1-2 po q8 hours prn spasm #50. Pt will call office to schedule 3-4wk f/u with Dr.  Venetia Maxon.       Medication List     As of 12/26/2011  7:51 AM    ASK your doctor about these medications         aspirin EC 81 MG tablet   Take 81 mg by mouth daily.      calcium-vitamin D 500-200 MG-UNIT per tablet   Commonly known as: OSCAL WITH D   Take 1 tablet by mouth daily.      diazepam 5 MG tablet   Commonly known as: VALIUM   Take 5 mg by mouth every 8 (eight) hours as needed. For anxiety      fish oil-omega-3 fatty acids 1000 MG capsule   Take 3 g by mouth daily.      HYDROcodone-acetaminophen 7.5-325 MG per tablet   Commonly known as: NORCO   Take 1 tablet by mouth every 8 (eight) hours as needed. For pain      RED YEAST RICE PO   Take 1 tablet by mouth daily.      triamterene-hydrochlorothiazide 37.5-25 MG per tablet   Commonly known as: MAXZIDE-25   Take 1 tablet by mouth daily.         Signed: Georgiann Cocker 12/26/2011, 7:51 AM

## 2011-12-27 ENCOUNTER — Encounter (HOSPITAL_COMMUNITY): Payer: Self-pay | Admitting: Neurosurgery

## 2011-12-27 NOTE — Discharge Instructions (Signed)

## 2011-12-27 NOTE — Progress Notes (Signed)
Physical Therapy Treatment Patient Details Name: Cheyenne Gonzalez MRN: [id number] DOB: [birth year] Today's Date: 12/27/2011 Time: 7829-5621 PT Time Calculation (min): 19 min  PT Assessment / Plan / Recommendation Comments on Treatment Session  Pt s/p c4-7 fusion with continued activity limitation from pain. Pt agreeable to attempting mobility this am as waiting for breakfast prior to pain meds but limited with ambulation due to pain. Pt encouraged to continue increasing LUE movement and gait to progress to more independent function. Pt did state she was able to get washed up on her own with minimal setup help from spouse today. Will continue to follow.     Follow Up Recommendations        Does the patient have the potential to tolerate intense rehabilitation     Barriers to Discharge        Equipment Recommendations       Recommendations for Other Services    Frequency     Plan Discharge plan remains appropriate;Frequency remains appropriate    Precautions / Restrictions Precautions Precautions: Cervical;Fall Precaution Comments: pt again educated for cervical precautions Required Braces or Orthoses: Cervical Brace Cervical Brace: Hard collar   Pertinent Vitals/Pain 9/10 left shoulder pain throughout, RN aware, repositioned    Mobility  Bed Mobility Bed Mobility: Not assessed Transfers Sit to Stand: 5: Supervision;From bed Stand to Sit: 5: Supervision;To chair/3-in-1 Details for Transfer Assistance: cueing for safety and scooting back on surface at chair Ambulation/Gait Ambulation/Gait Assistance: 4: Min guard Ambulation Distance (Feet): 100 Feet Assistive device: None Ambulation/Gait Assistance Details: pt able to relax LUE by her side for initiation of gait grossly 50' then returned to holding arm across in front of her for support. Pt continues to demonstrate very slow gait speed  Gait Pattern: Shuffle;Decreased stride length    Exercises     PT Diagnosis:    PT  Problem List:   PT Treatment Interventions:     PT Goals Acute Rehab PT Goals PT Goal: Sit to Stand - Progress: Progressing toward goal PT Goal: Stand to Sit - Progress: Progressing toward goal PT Goal: Ambulate - Progress: Progressing toward goal  Visit Information  Last PT Received On: 12/27/11 Assistance Needed: +1    Subjective Data  Subjective: I'm still having so much pain in the left arm   Cognition  Overall Cognitive Status: Appears within functional limits for tasks assessed/performed Arousal/Alertness: Awake/alert Orientation Level: Appears intact for tasks assessed Behavior During Session: [facility] for tasks performed    Balance     End of Session PT - End of Session Equipment Utilized During Treatment: Cervical collar Activity Tolerance: Patient limited by pain Patient left: in chair;with call bell/phone within reach;with nursing in room;with family/visitor present Nurse Communication: Mobility status   GP Functional Assessment Tool Used: clinical judgement Functional Limitation: Mobility: Walking and moving around Mobility: Walking and Moving Around Current Status [phone]): At least 1 percent but less than 20 percent impaired, limited or restricted Mobility: Walking and Moving Around Goal Status [phone]): 0 percent impaired, limited or restricted Mobility: Walking and Moving Around Discharge Status [phone]): At least 1 percent but less than 20 percent impaired, limited or restricted   Delorse Lek 12/27/2011, 8:22 AM Delaney Meigs, PT [phone]

## 2011-12-30 NOTE — Discharge Summary (Signed)
As above.

## 2012-06-24 ENCOUNTER — Other Ambulatory Visit (HOSPITAL_COMMUNITY): Payer: Self-pay | Admitting: Neurosurgery

## 2012-06-24 ENCOUNTER — Other Ambulatory Visit: Payer: Self-pay | Admitting: Neurosurgery

## 2012-06-24 DIAGNOSIS — M47812 Spondylosis without myelopathy or radiculopathy, cervical region: Secondary | ICD-10-CM

## 2012-06-24 DIAGNOSIS — M546 Pain in thoracic spine: Secondary | ICD-10-CM

## 2012-07-10 ENCOUNTER — Encounter (HOSPITAL_COMMUNITY): Payer: Self-pay | Admitting: Pharmacist

## 2012-07-14 ENCOUNTER — Other Ambulatory Visit (HOSPITAL_COMMUNITY): Payer: BC Managed Care – PPO

## 2012-07-14 ENCOUNTER — Ambulatory Visit (HOSPITAL_COMMUNITY): Payer: BC Managed Care – PPO

## 2012-07-23 ENCOUNTER — Ambulatory Visit (HOSPITAL_COMMUNITY)
Admission: RE | Admit: 2012-07-23 | Discharge: 2012-07-23 | Disposition: A | Discharge status: Home / Self Care | Payer: BC Managed Care – PPO | Source: Ambulatory Visit | Attending: Neurosurgery | Admitting: Neurosurgery

## 2012-07-23 ENCOUNTER — Ambulatory Visit (HOSPITAL_COMMUNITY): Payer: BC Managed Care – PPO

## 2012-07-23 ENCOUNTER — Other Ambulatory Visit (HOSPITAL_COMMUNITY): Payer: Self-pay | Admitting: Neurosurgery

## 2012-07-23 DIAGNOSIS — M5090 Cervical disc disorder, unspecified, unspecified cervical region: Secondary | ICD-10-CM | POA: Insufficient documentation

## 2012-07-23 DIAGNOSIS — M47812 Spondylosis without myelopathy or radiculopathy, cervical region: Secondary | ICD-10-CM

## 2012-07-23 DIAGNOSIS — M546 Pain in thoracic spine: Secondary | ICD-10-CM

## 2012-07-23 DIAGNOSIS — IMO0002 Reserved for concepts with insufficient information to code with codable children: Secondary | ICD-10-CM | POA: Insufficient documentation

## 2012-07-23 DIAGNOSIS — R52 Pain, unspecified: Secondary | ICD-10-CM

## 2012-07-23 MED ORDER — HYDROMORPHONE HCL 2 MG PO TABS
2.0000 mg | ORAL_TABLET | ORAL | Status: DC | PRN
  Administered 2012-07-23: 2 mg via ORAL

## 2012-07-23 MED ORDER — IOHEXOL 300 MG/ML  SOLN
10.0000 mL | Freq: Once | INTRAMUSCULAR | Status: AC | PRN
  Administered 2012-07-23: 10 mL via INTRATHECAL

## 2012-07-23 MED ORDER — ONDANSETRON HCL 4 MG/2ML IJ SOLN: 4.0000 mg | Freq: Four times a day (QID) | INTRAMUSCULAR | Status: DC | PRN

## 2012-07-23 MED ORDER — DIAZEPAM 5 MG PO TABS
10.0000 mg | ORAL_TABLET | Freq: Once | ORAL | Status: AC
  Administered 2012-07-23: 10 mg via ORAL

## 2012-07-23 MED ORDER — DIAZEPAM 5 MG PO TABS
ORAL_TABLET | ORAL | Status: AC
  Administered 2012-07-23: 10 mg via ORAL
  Filled 2012-07-23: qty 2

## 2012-07-23 NOTE — Procedures (Signed)
Omnipaque 300 L 23 puncture

## 2013-08-05 IMAGING — RF DG C-ARM 61-120 MIN
1 series · 1 of 1 positions shown · non-contrast
Comparison: 02/27/2011 myelogram.

CLINICAL DATA: C4-C7 posterior fusion.

DG C-ARM 1-60 MIN,DG CERVICAL SPINE - 1 VIEW

[Series 1: run · 1 of 1 slices shown]
[im 1/1]
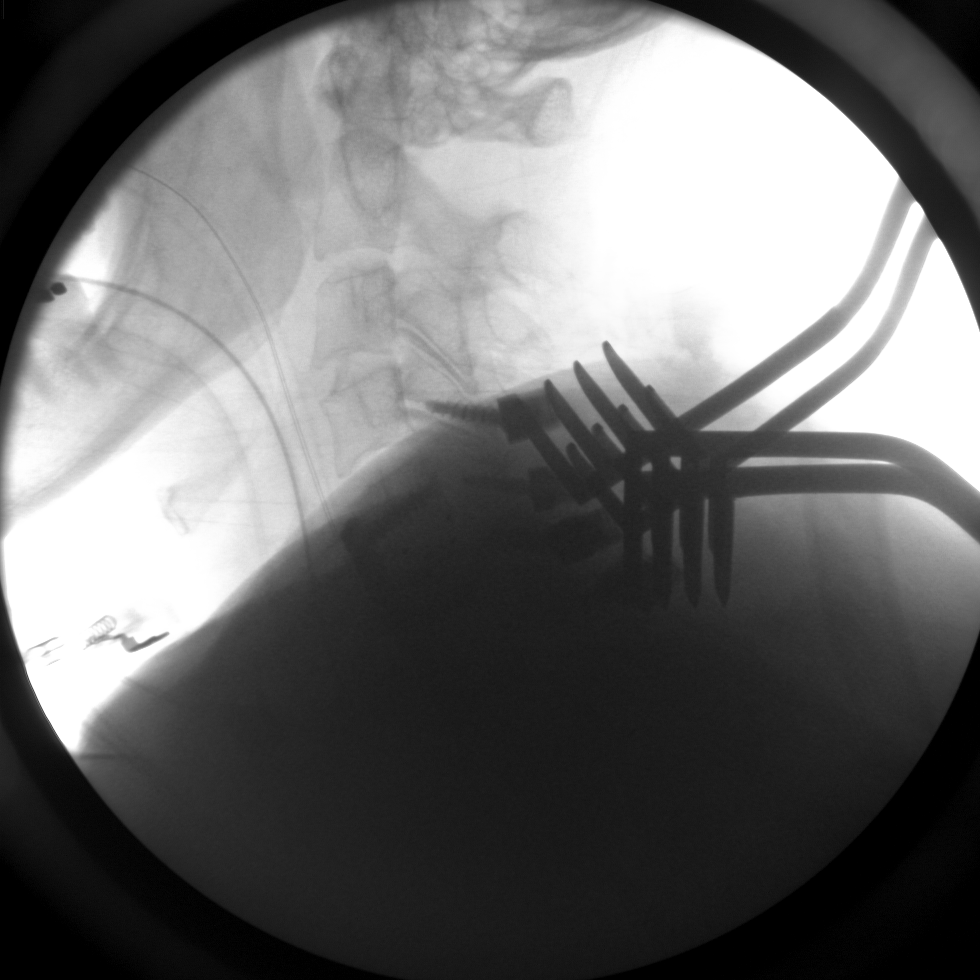

[1 of 1 positions shown; findings below may reference images not displayed]

FINDINGS: Single intraoperative C-arm view reveals prior anterior
fusion C5 and inferiorly with new posterior fusion C4 and
inferiorly.  Below the C4 level, evaluation is limited by patient's
shoulders and can be evaluated on follow-up.
IMPRESSION: Single intraoperative C-arm view reveals prior anterior fusion C5
and inferiorly with new posterior fusion C4 and inferiorly.  Below
the C4 level, evaluation is limited by patient's shoulders and can
be evaluated on follow-up.

## 2020-06-13 ENCOUNTER — Other Ambulatory Visit: Payer: Self-pay | Admitting: Neurosurgery

## 2020-06-13 DIAGNOSIS — M5412 Radiculopathy, cervical region: Secondary | ICD-10-CM

## 2020-06-14 ENCOUNTER — Telehealth: Payer: Self-pay

## 2020-06-14 NOTE — Progress Notes (Signed)
Phone call to patient to verify medication list and allergies for myelogram procedure. Medications pt is currently taking are safe to continue to take. Advised pt if any new medications are started prior to procedure to call and make us aware. Pt also instructed to have a driver the day of the procedure, she would be with us around 2 hours, and discharge instructions reviewed. Pt verbalized understanding. 

## 2021-08-07 ENCOUNTER — Other Ambulatory Visit: Payer: Self-pay | Admitting: Neurosurgery

## 2021-08-07 DIAGNOSIS — M5412 Radiculopathy, cervical region: Secondary | ICD-10-CM

## 2021-08-27 ENCOUNTER — Ambulatory Visit
Admission: RE | Admit: 2021-08-27 | Discharge: 2021-08-27 | Disposition: A | Discharge status: Home / Self Care | Payer: Medicare Other | Source: Ambulatory Visit | Attending: Neurosurgery | Admitting: Neurosurgery

## 2021-08-27 ENCOUNTER — Ambulatory Visit
Admission: RE | Admit: 2021-08-27 | Discharge: 2021-08-27 | Disposition: A | Discharge status: Home / Self Care | Payer: Self-pay | Source: Ambulatory Visit | Attending: Neurosurgery | Admitting: Neurosurgery

## 2021-08-27 DIAGNOSIS — M5412 Radiculopathy, cervical region: Secondary | ICD-10-CM

## 2021-08-27 MED ORDER — IOPAMIDOL (ISOVUE-M 300) INJECTION 61%
10.0000 mL | Freq: Once | INTRAMUSCULAR | Status: AC
  Administered 2021-08-27: 10 mL via INTRATHECAL

## 2021-08-27 MED ORDER — DIAZEPAM 5 MG PO TABS: 5.0000 mg | ORAL_TABLET | Freq: Once | ORAL | Status: DC

## 2021-08-27 MED ORDER — ONDANSETRON HCL 4 MG/2ML IJ SOLN
4.0000 mg | Freq: Once | INTRAMUSCULAR | Status: AC | PRN
  Administered 2021-08-27: 4 mg via INTRAMUSCULAR

## 2021-08-27 MED ORDER — MEPERIDINE HCL 50 MG/ML IJ SOLN
50.0000 mg | Freq: Once | INTRAMUSCULAR | Status: AC | PRN
  Administered 2021-08-27: 50 mg via INTRAMUSCULAR

## 2021-08-27 NOTE — Discharge Instr - Other Orders (Addendum)
1320: pt reports pain 10/10 from myelogram procedure. See MAR.  1333: pt back in nursign recovery area. States pain is 1/10. Pt resting and eating crackers. No complaints at this time

## 2021-08-27 NOTE — Discharge Instructions (Signed)
